# Patient Record
Sex: Male | Born: 1970 | Race: Black or African American | Hispanic: No | Marital: Married | Smoking: Current every day smoker
Health system: Southern US, Community
[De-identification: ages and names within clinical notes are randomized; demographics above are authoritative.]

---

## 2004-11-15 ENCOUNTER — Ambulatory Visit (HOSPITAL_COMMUNITY): Admission: RE | Admit: 2004-11-15 | Discharge: 2004-11-15 | Payer: Self-pay | Admitting: Otolaryngology

## 2005-01-16 ENCOUNTER — Encounter: Admission: RE | Admit: 2005-01-16 | Discharge: 2005-01-16 | Payer: Self-pay | Admitting: Otolaryngology

## 2005-01-30 ENCOUNTER — Ambulatory Visit (HOSPITAL_COMMUNITY): Admission: RE | Admit: 2005-01-30 | Discharge: 2005-01-30 | Payer: Self-pay | Admitting: Otolaryngology

## 2005-01-30 ENCOUNTER — Encounter (INDEPENDENT_AMBULATORY_CARE_PROVIDER_SITE_OTHER): Payer: Self-pay | Admitting: *Deleted

## 2005-01-30 ENCOUNTER — Ambulatory Visit (HOSPITAL_BASED_OUTPATIENT_CLINIC_OR_DEPARTMENT_OTHER): Admission: RE | Admit: 2005-01-30 | Discharge: 2005-01-30 | Payer: Self-pay | Admitting: Otolaryngology

## 2005-12-16 IMAGING — CT CT MAXILLOFACIAL W/O CM
1 of 2 series · 15 of 30 positions shown, 19 images · IV contrast (agent unspecified)
Comparison: None.

CLINICAL DATA: Chronic polypoid pansinusitis/prior sinus surgery.
 MAXILLOFACIAL CT WITHOUT CONTRAST:
 Contiguous 2.5 mm sliced thickness cuts were obtained through the paranasal sinuses with the patient in the extended prone position.  Additional 2 mm axial cuts were obtained.

[Series 3694: — · axial · 0.37mm/px · z∈[-705,-615]mm · 15 of 100 slices shown, 19 images]
[im 5/100  brain]
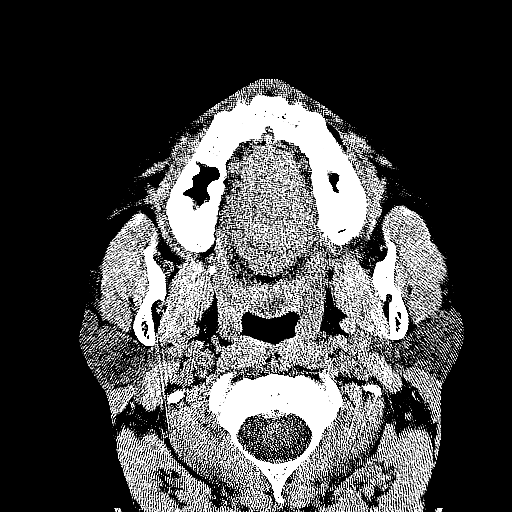
[im 5/100  bone]
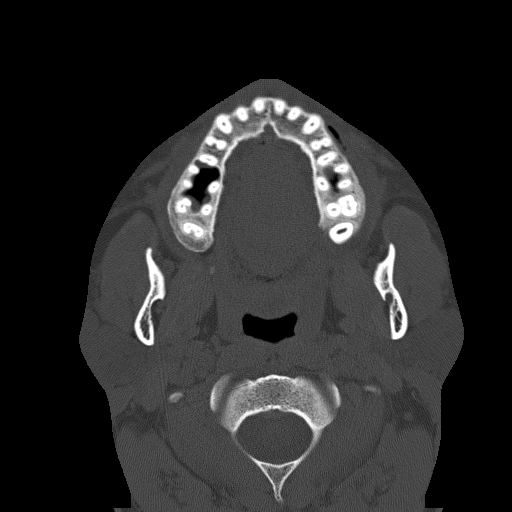
[im 10/100  bone]
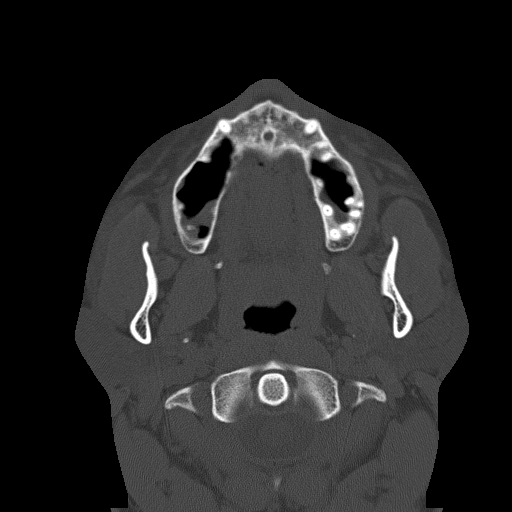
[im 20/100  bone]
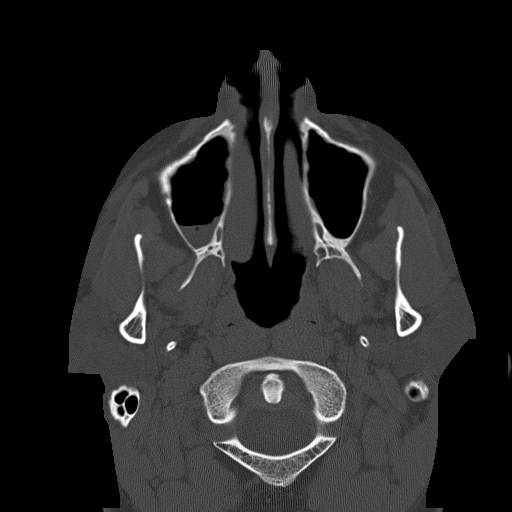
[im 25/100  bone]
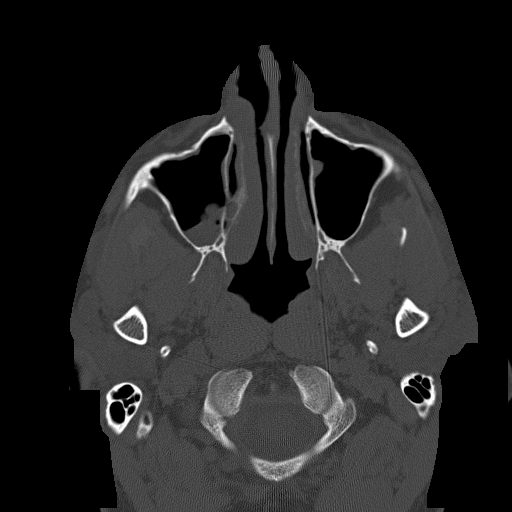
[im 30/100  brain]
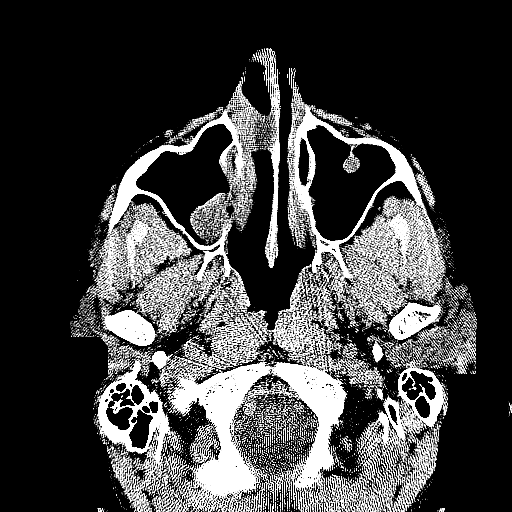
[im 30/100  bone]
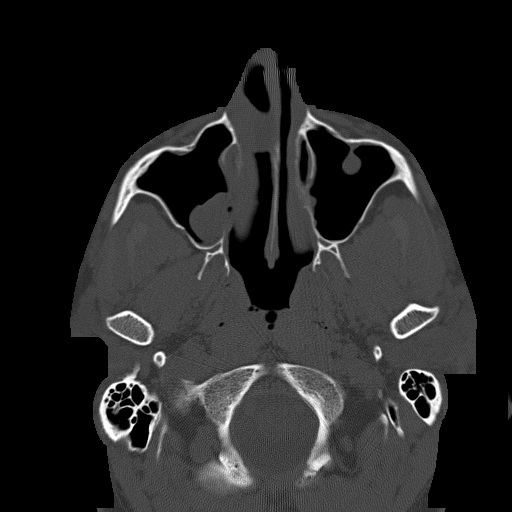
[im 35/100  bone]
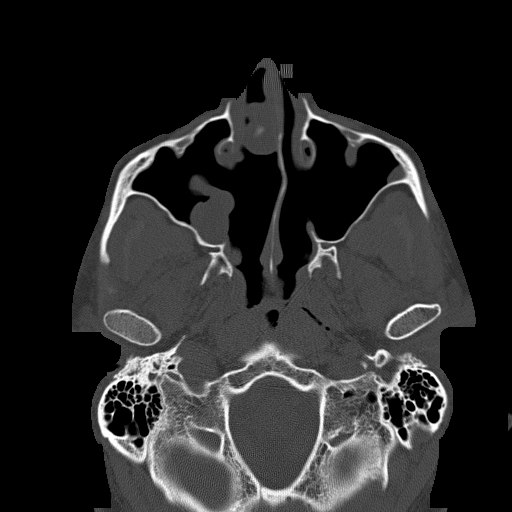
[im 45/100  bone]
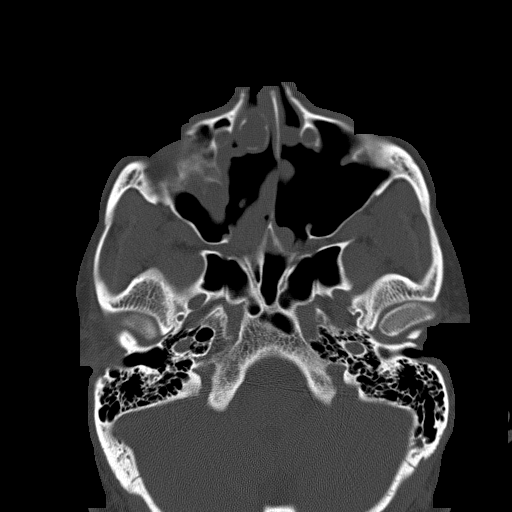
[im 50/100  bone]
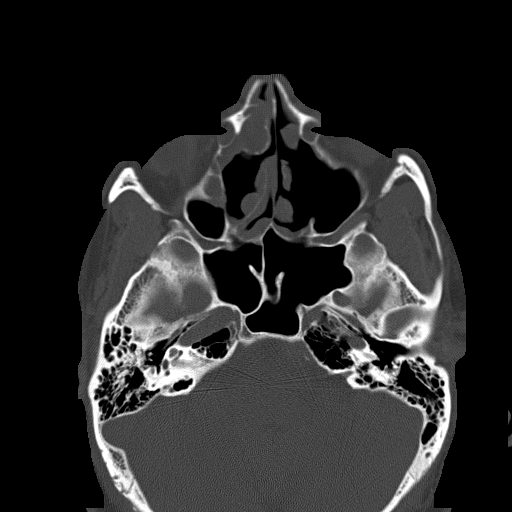
[im 55/100  brain]
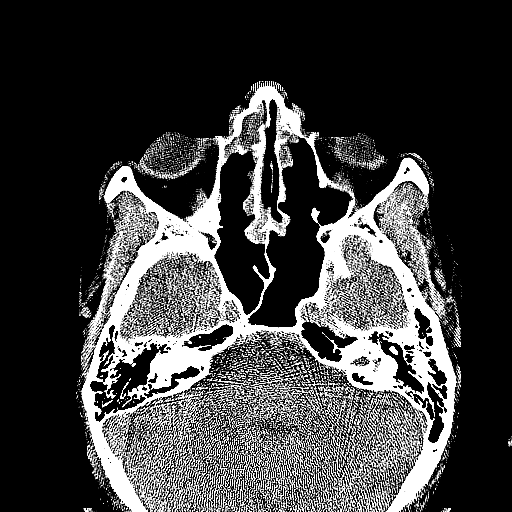
[im 55/100  bone]
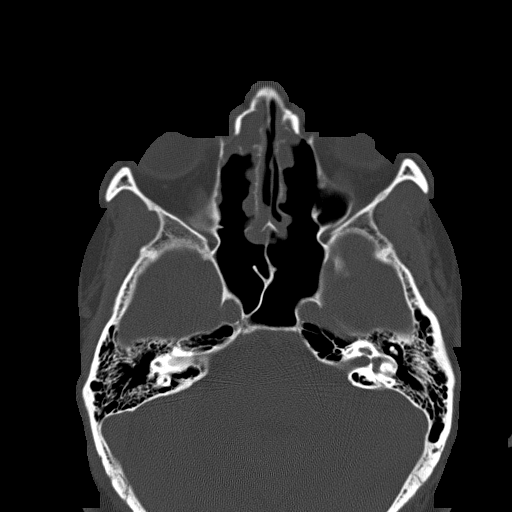
[im 65/100  bone]
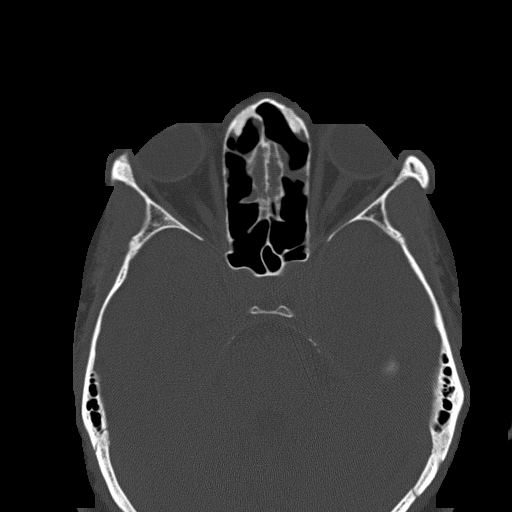
[im 70/100  bone]
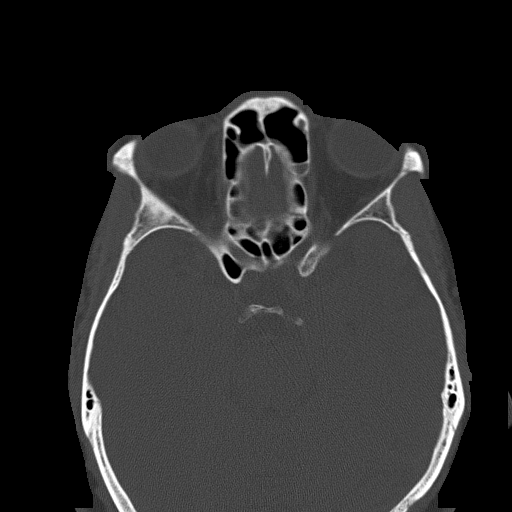
[im 75/100  bone]
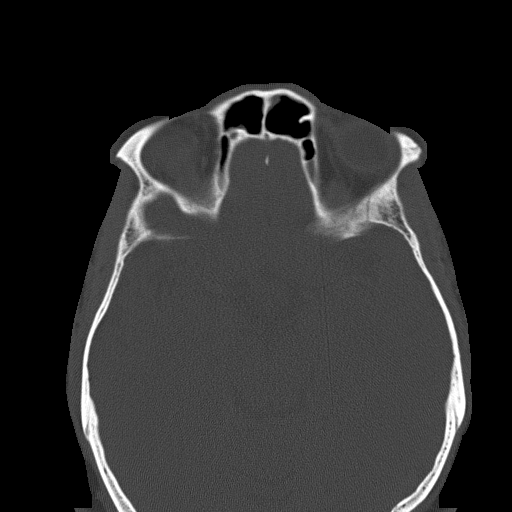
[im 80/100  brain]
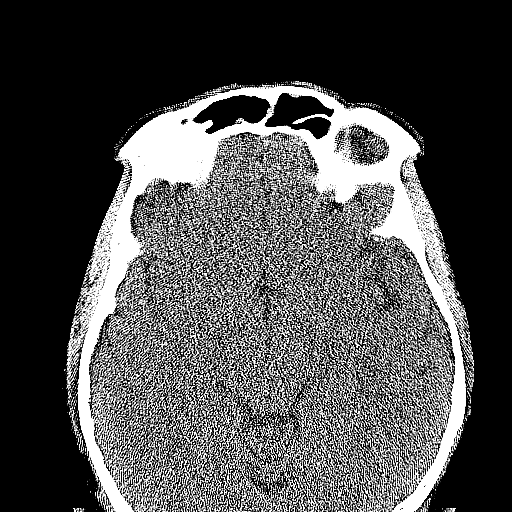
[im 80/100  bone]
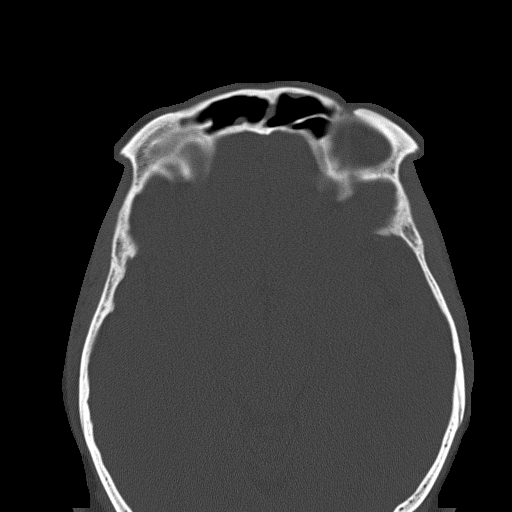
[im 90/100  bone]
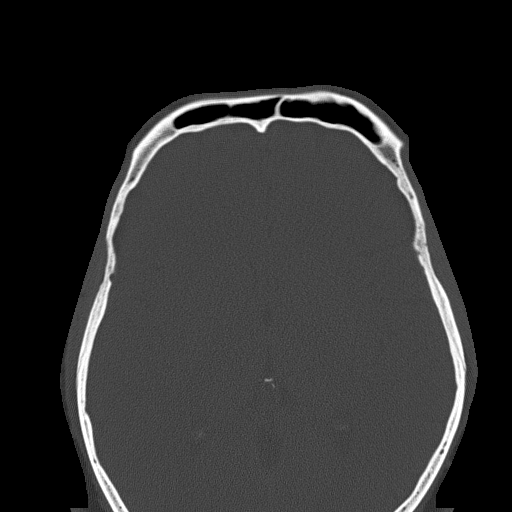
[im 95/100  bone]
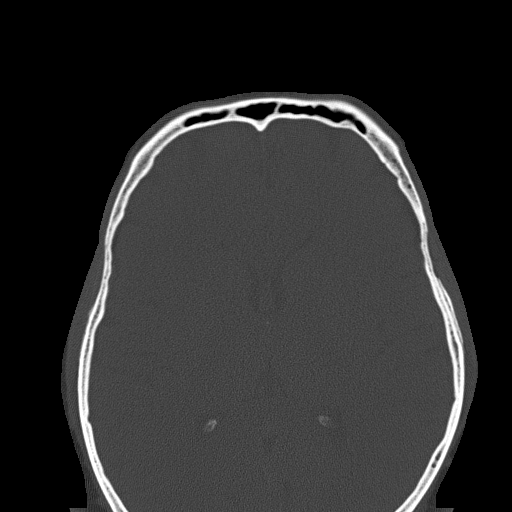

[15 of 30 positions shown; findings below may reference images not displayed]

Frontal sinuses show minimal areas of mucosal thickening.  Some of these are rounded and could be small polyps.  There is no fluid in the frontal sinuses.  
 There is some mucosal thickening of the remaining ethmoid air cells, but many of these septations are no longer evident suggesting prior surgery.  There also appears to be communication between the ethmoid and sphenoid sinuses, likely surgical.  Likewise, patient has had resection of the medial wall and uncinate processes of both maxillary sinuses which communicate openly with the ethmoid sinuses.  Despite this, there is lobulated soft tissue density within both maxillary sinuses, in a polypoid configuration.  This involves the right greater than the left.  There may be a small amount of fluid in the right maxillary sinus.  On the axial cuts, it looks like air fluid level.  In the coronal plane, it looks like it may be simply a meniscus.  Similar, but less prominent changes are noted in the antrum of the left maxillary sinus.  Lobulated soft tissue density is also noted in the nasal turbinates bilaterally, right greater than left.
IMPRESSION: 1.  Chronic polypoid like changes of sinusitis noted as described above.  Polypoid changes are also noted in the nasal turbinates. 
 2.  There may be a small amount of fluid in the maxillary sinuses bilaterally.  
 3.  Extensive post surgical changes.

## 2006-02-16 IMAGING — CT CT MAXILLOFACIAL W/O CM
1 series · 15 of 30 positions shown, 19 images · IV contrast (agent unspecified)
Comparison: none

CLINICAL DATA: 33 year old with sinusitis. 
 CT SINUSES WITHOUT CONTRAST:
 CT examination of the paranasal sinuses was performed with coronal and axial imaging with the Insta-Trak in place on the axial images for preoperative assessment and intraoperative guidance.   This examination is compared to the previous study from [HOSPITAL] which is dated 11/15/04.
 When compared to the prior study, there is markedly progressive sinonasal polyposis.  The maxillary sinuses are demonstrating significant increased opacification with polypoid soft tissue density throughout.  This extends into the nasal cavities.  There appears to be a concha bullosa of the middle turbinate on the right which appears to be filled with soft tissue density.  There are extensive surgical changes related to previous sinonasal surgery with wide communication between the maxillary sinuses and the nasal cavity.  The middle ear cavities and mastoid air cells are clear.  There is minimal mucoperiosteal thickening or small polyps in the left half of the sphenoid sinus.  The frontal sinuses demonstrate minimal mucoperiosteal thickening.  There is significant deviation of the bony nasal septum leftward with leftward spurring which does narrow the left inferior meatus.  There is mucosal thickening of the inferior turbinate and also the left middle turbinate.

[Series 2: sinus prone · axial · 0.33mm/px · z∈[+37,+134]mm · 15 of 40 slices shown, 19 images]
[im 2/40  brain]
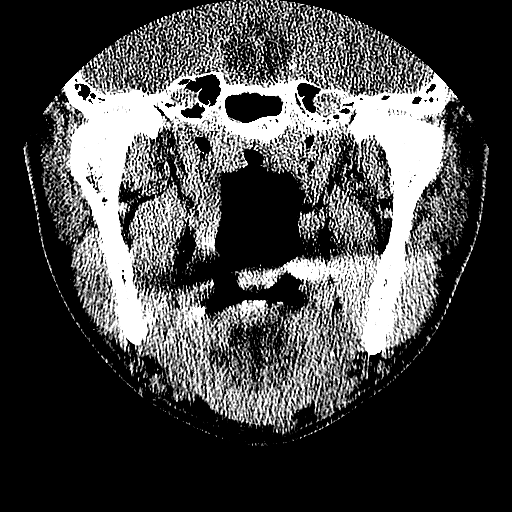
[im 2/40  bone]
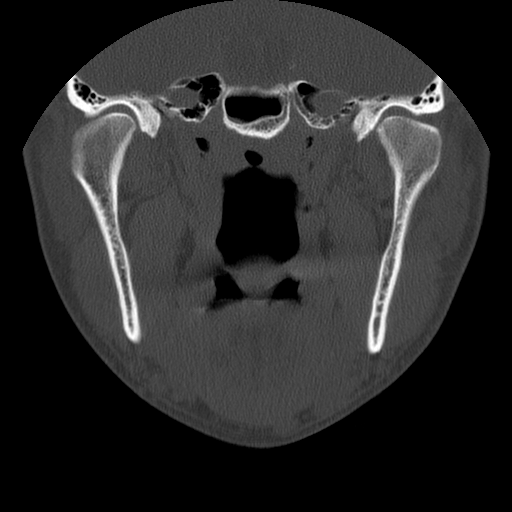
[im 5/40  bone]
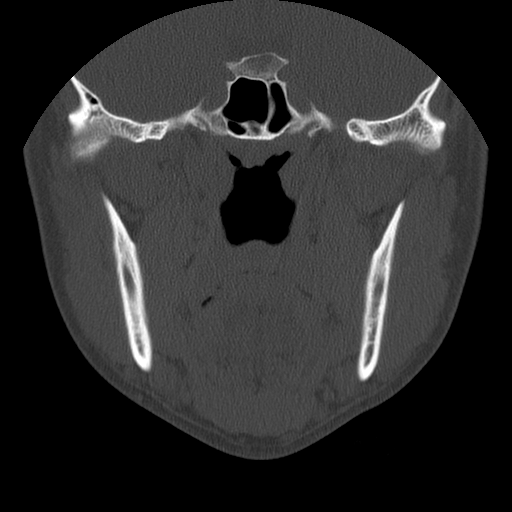
[im 7/40  bone]
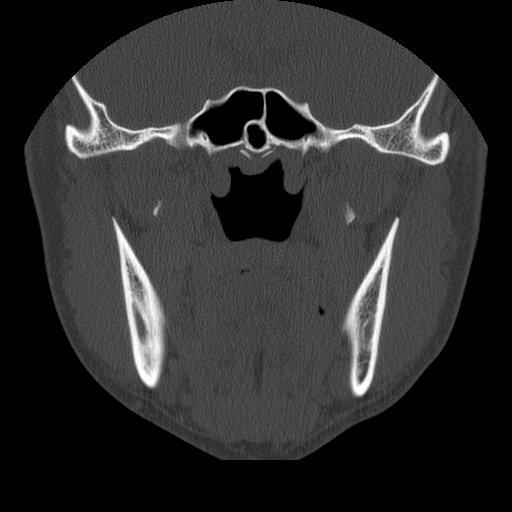
[im 10/40  bone]
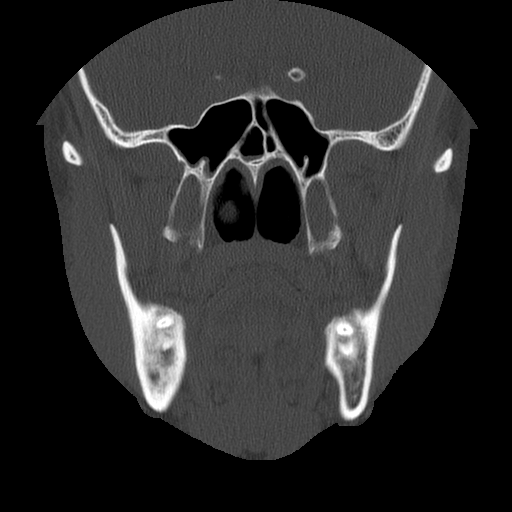
[im 13/40  brain]
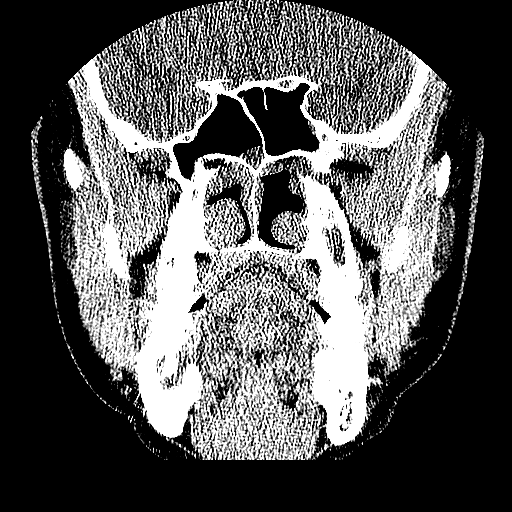
[im 13/40  bone]
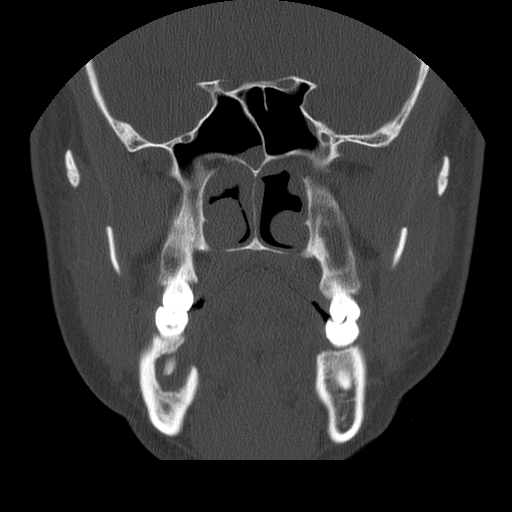
[im 15/40  bone]
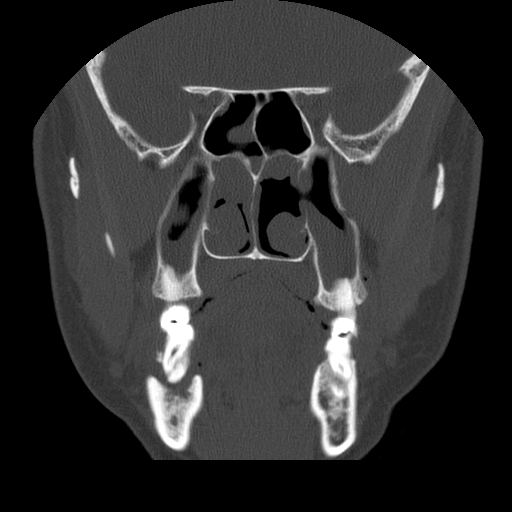
[im 18/40  bone]
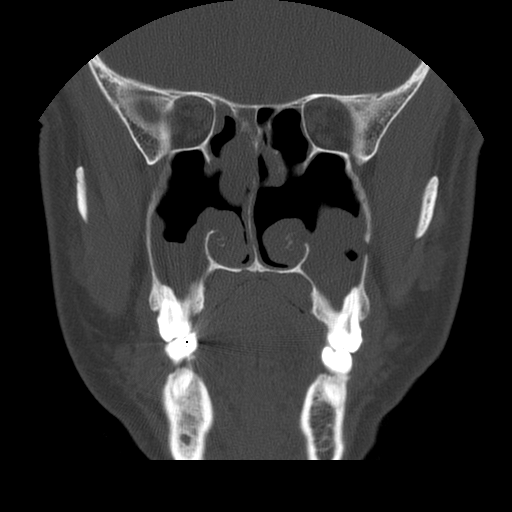
[im 21/40  bone]
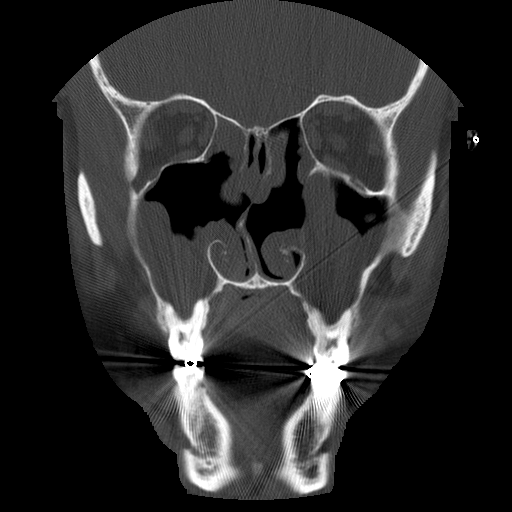
[im 22/40  brain]
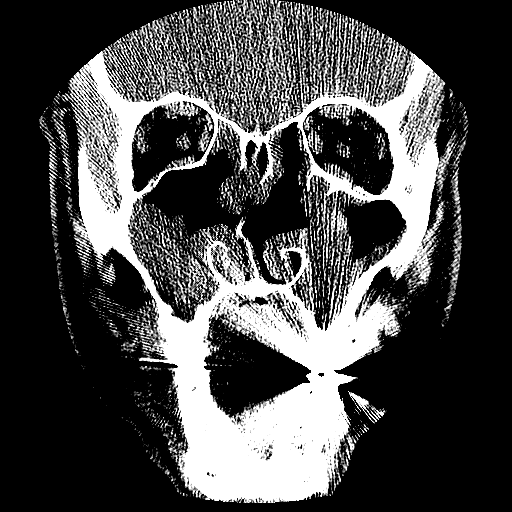
[im 22/40  bone]
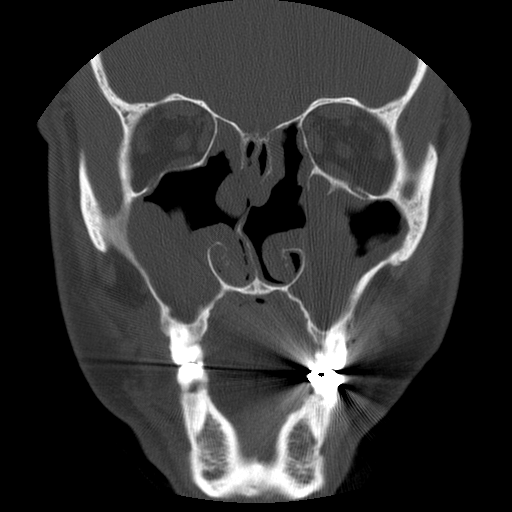
[im 25/40  bone]
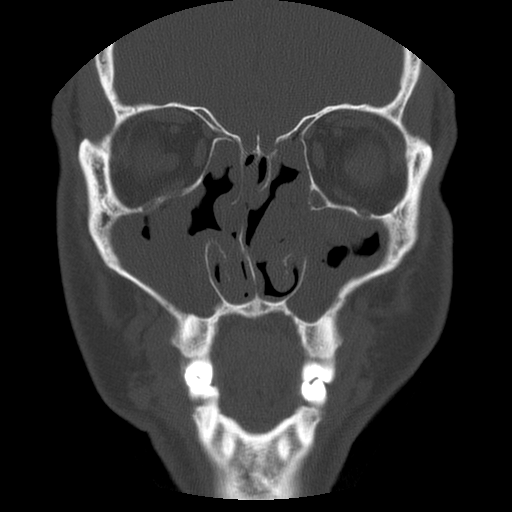
[im 27/40  bone]
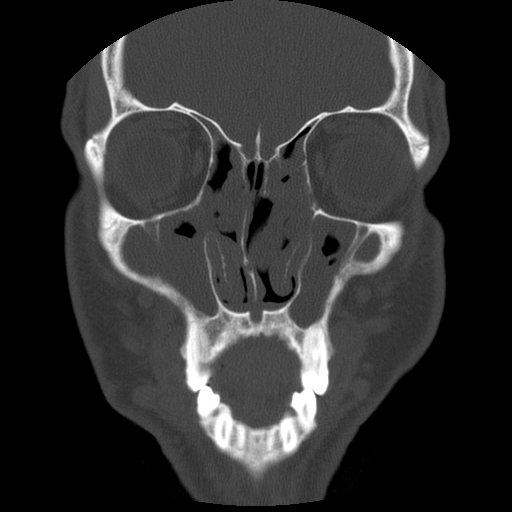
[im 30/40  bone]
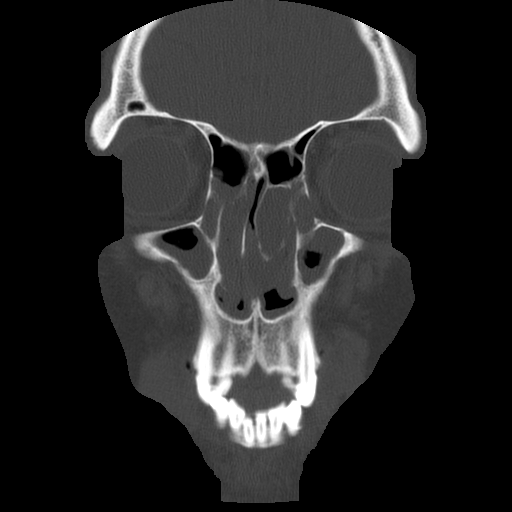
[im 33/40  brain]
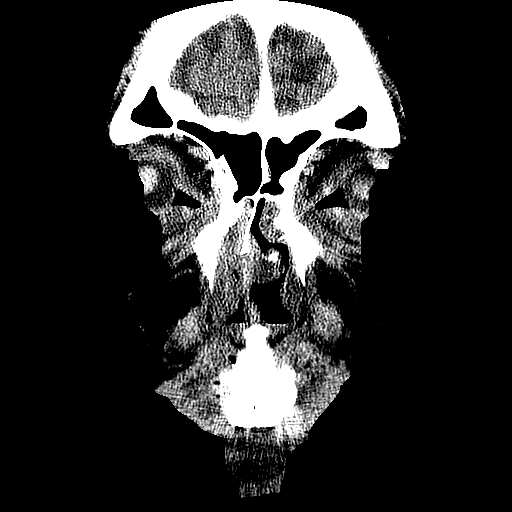
[im 33/40  bone]
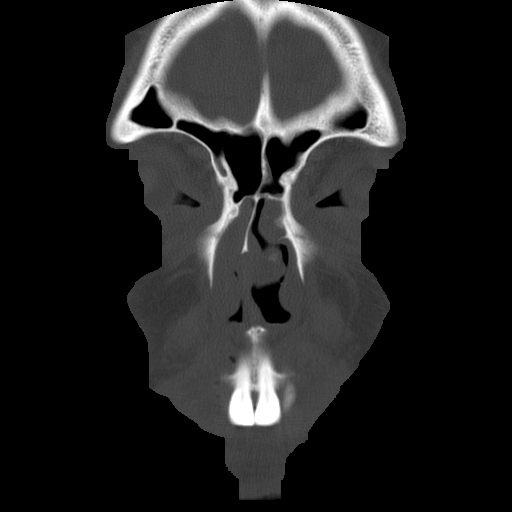
[im 35/40  bone]
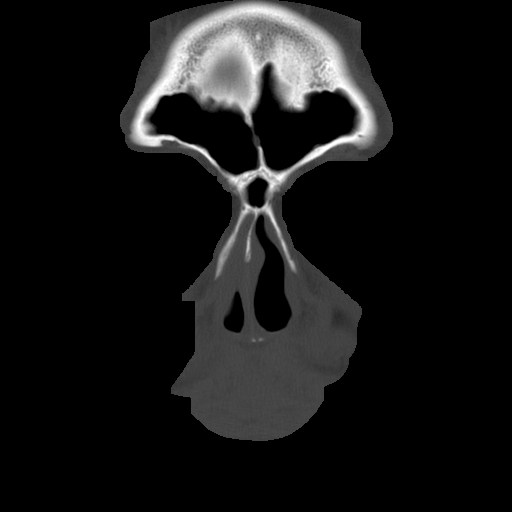
[im 38/40  bone]
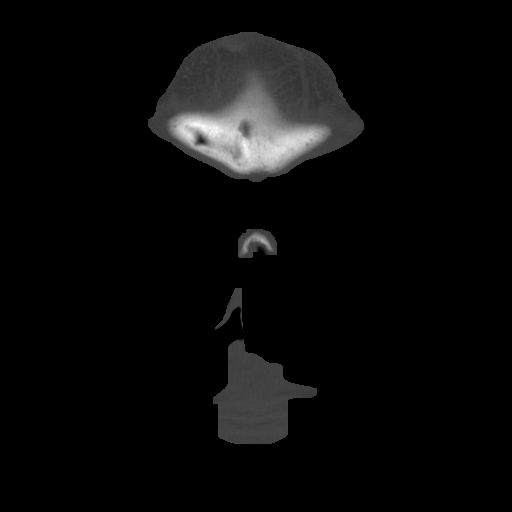

[15 of 30 positions shown; findings below may reference images not displayed]

IMPRESSION: 1.  Insta-Trak imaging for preoperative assessment and intraoperative guidance. 
 2.  Significantly progressive sinonasal polyposis when compared to the prior CT scan from [DATE].  Stable post surgical changes with wide communication between the maxillary sinuses and the nasal cavity.
 4.  Leftward deviation of the bony nasal septum with narrowing of the left inferior meatus.
 5.  Mucosal thickening of the left middle turbinate and inferior turbinates bilaterally.  
 6.  Relatively clear frontal and sphenoid sinuses. 
 7.  Clear middle ear cavities and mastoid air cells.

## 2009-10-03 ENCOUNTER — Emergency Department (HOSPITAL_COMMUNITY): Admission: EM | Admit: 2009-10-03 | Discharge: 2009-10-03 | Payer: Self-pay | Admitting: Emergency Medicine

## 2011-01-13 NOTE — Op Note (Signed)
NAME:  Cristian Brown, Cristian Brown                ACCOUNT NO.:  [id number]   MEDICAL RECORD NO.:  [id number]          PATIENT TYPE:  AMB   LOCATION:  [facility]SC                          FACILITY:  [facility]   PHYSICIAN:  Karol T. Lazarus Salines, M.D. 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 Right ethmoid/middle turbinate conchal  mucocele.   POSTOPERATIVE DIAGNOSIS:  Chronic polypoid pansinusitis/polypoid  degeneration of nose and paranasal sinuses. Right ethmoid/middle turbinate  conchal mucocele.   PROCEDURE PERFORMED:  Right endoscopic revision anterior ethmoidectomy.  Bilateral nasal endoscopic polypectomy with InstaTrak guidance.   SURGEON:  Gloris Manchester. Lazarus Salines, M.D.   ANESTHESIA:  General orotracheal.   BLOOD LOSS:  Minimal.   COMPLICATIONS:  None.   FINDINGS:  Overall anterior leftward septal deviation. Polypoid degeneration  of the mucosa throughout the previous surgical sinus blocks with some frank  polyps, right more so than left. On the right, polyps obstructing the  surgical antrostomy. A large expanded middle turbinate conchal cell/mucocele  with some polypoid degeneration of the anterior pole of the middle  turbinate.   PROCEDURE:  With the patient in the comfortable supine position, general  orotracheal anesthesia was induced without difficulty. At an appropriate  level, the patient was placed in a slight sitting position. A saline  moistened throat pack was placed. Nasal vibrissae were trimmed. Cocaine  crystals, 200 mg total were applied on cotton carriers to the anterior  ethmoid and sphenopalatine ganglion regions on both sides. Cocaine solution,  160 mg total, was applied on 1/2 x 3 inch cottonoids to both sides of the  septal mucosa. One percent Xylocaine with 1:100,000 epinephrine was  infiltrated into the anterior nose including the septum and lateral walls, 8  cc total. Several minutes were allowed for these to take effect. A sterile  preparation and draping of the midface was accomplished including placement  of the InstaTrak head gear.   The materials were removed from the nose and observed to be intact and  correct in number. Additional 1% Xylocaine with 1:100,000 epinephrine on a  25 gauge spinal needle were used to infiltrate the polyps and the middle  turbinates on both sides. Additional time was allowed for hemostasis to take  effect. The InstaTrak apparatus was registered, calibrated, and verified in  the standard fashion. Good registration was observed.   Beginning on the left side of the nose, the 0 degree endoscope was used to  completely inspect the nasal chamber. There were polyps up in the ethmoid  block which were gently removed with the power debrider. There were more  moderate, more involved polyps back into the sphenoethmoidal recess which  were also removed. No bony work was required on the left side. The fairly  large polyps involving the antrostomy were also removed with the power  debrider. This material was sent for separate specimen. There was no  evidence of violation of lamina papyracea or the fovea ethmoidalis. This  side was completed and attention was returned to the right side.   The 0 degree endoscope was once again used to inspect the nasal cavity. Some  polypoid degeneration in the antrostomy and in the mid and posterior ethmoid  blocks were removed with the power debrider. The sphenoethmoidal recess also  was partially blocked and the polyps here were carefully removed. No bony  work was required.   Returning anteriorly in the right nose, a polypoid lesion consistent with  the mucocele on x-ray was first debulked with a power debrider. Upon  encountering the bony skeleton, the lateral wall was opened with the punch  forceps and  carefully enlarged with the punch forceps and with the power  debrider. The medial aspect of the concha and comprising the remnant of the  middle turbinate, was stable and firm and was not further disturbed. Some  polypoid changes were removed approaching the nasal frontal recess which was  opened but not instrumented. At this point with better visualization upon  removal of the mucocele, some additional polypoid changes in the middle  ethmoid region were removed. Polypoid changes down into the antrum were  removed. There was a Haller cell on the x-ray which was opened with the  punch forceps and the area was debrided. This completed the removal of the  mucocele and the polypoid change on the right side. Inspection of both sides  revealed no signs of spinal fluid leak or violation of lamina papyracea.  Hemostasis was observed on both sides. The nose was carefully suctioned  clean.   A triple thickness Neosporin-impregnated Telfa pack with a 2-0 silk tag was  placed in the ethmoid block in each side and tied externally. Again,  hemostasis was observed. The pharynx was suctioned free and throat pack was  removed. A bite block was placed for anesthesia. The patient was returned to  anesthesia, awakened, extubated, and transferred to recovery in stable  condition.    A 40 year old black male with a long history of nasal polyposis status post  a previous endoscopic sinus exenteration by Dr. Tamera Stands in 1997 has  recently developed polypoid obstruction and had a painful syndrome  consistent with an infected mucocele of the right middle turbinate several  months back, hence the indication for the several components of today's  procedure. Anticipate a routine postoperative recovery with attention to  analgesia, antibiosis, ice, elevation, nasal hygiene measures. We will  remove the nasal packs in 1-2 days. He will limit strenuous activities for  two weeks.     KTW/MEDQ  D:  01/30/2005  T:   01/30/2005  Job:  [id number]   cc:   Laverle Hobby, M.D.

## 2018-12-11 DIAGNOSIS — L0201 Cutaneous abscess of face: Secondary | ICD-10-CM | POA: Diagnosis not present

## 2020-03-17 DIAGNOSIS — R208 Other disturbances of skin sensation: Secondary | ICD-10-CM | POA: Diagnosis not present

## 2020-03-17 DIAGNOSIS — Z0189 Encounter for other specified special examinations: Secondary | ICD-10-CM | POA: Diagnosis not present

## 2020-03-25 ENCOUNTER — Encounter (INDEPENDENT_AMBULATORY_CARE_PROVIDER_SITE_OTHER): Payer: Self-pay | Admitting: *Deleted

## 2020-04-06 DIAGNOSIS — Z1329 Encounter for screening for other suspected endocrine disorder: Secondary | ICD-10-CM | POA: Diagnosis not present

## 2020-04-06 DIAGNOSIS — Z0000 Encounter for general adult medical examination without abnormal findings: Secondary | ICD-10-CM | POA: Diagnosis not present

## 2020-04-16 DIAGNOSIS — Z0001 Encounter for general adult medical examination with abnormal findings: Secondary | ICD-10-CM | POA: Diagnosis not present

## 2020-09-01 ENCOUNTER — Other Ambulatory Visit: Payer: Self-pay

## 2020-09-01 DIAGNOSIS — Z20822 Contact with and (suspected) exposure to covid-19: Secondary | ICD-10-CM

## 2020-09-03 LAB — SARS-COV-2, NAA 2 DAY TAT

## 2020-09-03 LAB — NOVEL CORONAVIRUS, NAA: SARS-CoV-2, NAA: NOT DETECTED

## 2021-05-04 ENCOUNTER — Encounter: Payer: Self-pay | Admitting: *Deleted

## 2021-06-06 ENCOUNTER — Ambulatory Visit: Payer: 59

## 2021-06-24 ENCOUNTER — Ambulatory Visit (INDEPENDENT_AMBULATORY_CARE_PROVIDER_SITE_OTHER): Payer: 59 | Admitting: Allergy & Immunology

## 2021-06-24 ENCOUNTER — Other Ambulatory Visit: Payer: Self-pay

## 2021-06-24 VITALS — BP 120/70 | HR 61 | Temp 97.8°F | Resp 16 | Ht 71.0 in | Wt 210.0 lb

## 2021-06-24 DIAGNOSIS — J330 Polyp of nasal cavity: Secondary | ICD-10-CM | POA: Diagnosis not present

## 2021-06-24 DIAGNOSIS — J3089 Other allergic rhinitis: Secondary | ICD-10-CM

## 2021-06-24 DIAGNOSIS — J302 Other seasonal allergic rhinitis: Secondary | ICD-10-CM

## 2021-06-24 NOTE — Progress Notes (Signed)
NEW PATIENT  Date of Service/Encounter:  06/24/21  Consult requested by: Celene Squibb, MD   Assessment:   Polyp of nasal cavity  NSAID sensitivity - with worsening congestion with the use of them  Seasonal and perennial allergic rhinitis (grasses, weeds, trees, indoor molds, and outdoor molds)  Plan/Recommendations:    1. Polyp of nasal cavity - There are expectations that grow from the walls of the nose due to uncontrolled allergic disease. - We are going to start Xhance 2 sprays per nostril twice daily to help decrease the size of these. - They will call to arrange delivery of the [facility] via mail. - We are going to continue this for 3 months and if there is not enough improvement, we can submit for Dupixent. - Information on Dupixent provided. - This will help to shrink the polyps and help you to be able to breathe.  2. Seasonal and perennial allergic rhinitis - Testing today showed: grasses, weeds, trees, indoor molds, and outdoor molds - Copy of test results provided.  - Avoidance measures provided. - Start taking: Zyrtec (cetirizine) 10mg  tablet once daily (ESPECIALLY during the pollen season and when you are mowing the lawn) - You can use an extra dose of the antihistamine, if needed, for breakthrough symptoms.  - Consider nasal saline rinses 1-2 times daily to remove allergens from the nasal cavities as well as help with mucous clearance (this is especially helpful to do before the nasal sprays are given) - Consider allergy shots as a means of long-term control. - Allergy shots "re-train" and "reset" the immune system to ignore environmental allergens and decrease the resulting immune response to those allergens (sneezing, itchy watery eyes, runny nose, nasal congestion, etc).    - Allergy shots improve symptoms in 75-85% of patients.  - We can discuss more at the next appointment if the medications are not working for you.  3. Return in about 3 months (around  09/24/2021).    This note in its entirety was forwarded to the Provider who requested this consultation.  Subjective:   Cristian Brown is a 50 y.o. male presenting today for evaluation of  Chief Complaint  Patient presents with   Allergy Testing   Sinusitis    Patient in today for allergie testing to see if it will help with his constant stuffy nose.    Cristian Brown has a history of the following: Patient Active Problem List   Diagnosis Date Noted   Seasonal and perennial allergic rhinitis 06/28/2021   Polyp of nasal cavity 06/28/2021     History obtained from: chart review and patient.  Cristian Brown was referred by Celene Squibb, MD.     Cristian Brown is a 50 y.o. male presenting for an evaluation of chronic congestion .      Allergic Rhinitis Symptom History: He reports a lot of nasal drainage. He has been through 2 sinus surgeries. The first one was done in 1996 in [place]. In June 2006, he had another one done by Dr. Polly Cobia. It helps for 5 or so years, but then the symptoms resume with a vengeance. He is always draining and always congested.  This is throughout the year. He does have postnasal drip and constant now blowing. He does not clear his throat too often. He does spit a lot.   He has not been on nose sprays in the past. Prior to the surgeries, he was on steroids. After the surgeries, he was on Flonase  and other sinus sprays. He does not use Astelin and he has not needed steroids in a while. He did try Claritin-D for around 3 months and it did not help much.  He has had some sinus infections but these are infrequent. He had one month ago. He has noticed that things get worse with ibuprofen and Aleve and Tylenol.  He has no breathing problems at all.  He denies any wheezing or coughing.  He has never needed albuterol.  Sinus CT (May 2006)  IMPRESSION:  1. Insta-Trak imaging for preoperative assessment and intraoperative guidance.  2. Significantly progressive sinonasal  polyposis when compared to the prior CT scan from March.  3. Stable post surgical changes with wide communication between the maxillary sinuses and the nasal cavity.  4. Leftward deviation of the bony nasal septum with narrowing of the left inferior meatus.  5. Mucosal thickening of the left middle turbinate and inferior turbinates bilaterally.  6. Relatively clear frontal and sphenoid sinuses.  7. Clear middle ear cavities and mastoid air cells.   Otherwise, there is no history of other atopic diseases, including asthma, food allergies, drug allergies, stinging insect allergies, eczema, urticaria, or contact dermatitis. There is no significant infectious history. Vaccinations are up to date.    Past Medical History: Patient Active Problem List   Diagnosis Date Noted   Seasonal and perennial allergic rhinitis 06/28/2021   Polyp of nasal cavity 06/28/2021     Medication List:  Allergies as of 06/24/2021   Not on File      Medication List        Accurate as of June 24, 2021 11:59 PM. If you have any questions, ask your nurse or doctor.          nebivolol 10 MG tablet Commonly known as: BYSTOLIC Take 10 mg by mouth every other day.        Birth History: non-contributory  Developmental History: non-contributory  Past Surgical History: History reviewed. No pertinent surgical history.   Family History: History reviewed. No pertinent family history.   Social History: Cristian Brown lives at home with his family.  He was a house that is 52 years old.  There is hardwood in the main living areas and carpeting in the bedroom.  They have a plan for heating and cooling and would supplemental heating.  There are no dust mite covers on the bedding.  There is tobacco exposure in the car.  He fixes machines the past 6 months.  He is exposed to fumes, chemicals, and dust. He is working at [employer].    Review of Systems  Constitutional: Negative.  Negative for chills, fever,  malaise/fatigue and weight loss.  HENT:  Positive for congestion. Negative for ear discharge and ear pain.        Positive for postnasal drip.   Eyes:  Negative for pain, discharge and redness.  Respiratory:  Negative for cough, sputum production, shortness of breath and wheezing.   Cardiovascular: Negative.  Negative for chest pain and palpitations.  Gastrointestinal:  Negative for abdominal pain, constipation, diarrhea, heartburn, nausea and vomiting.  Skin: Negative.  Negative for itching and rash.  Neurological:  Negative for dizziness and headaches.  Endo/Heme/Allergies:  Negative for environmental allergies. Does not bruise/bleed easily.      Objective:   Blood pressure 120/70, pulse 61, temperature 97.8 F (36.6 C), temperature source Temporal, resp. rate 16, height 5\' 11"  (1.803 m), weight 210 lb (95.3 kg), SpO2 99 %. Body mass index is  29.29 kg/m.   Physical Exam:   Physical Exam Vitals reviewed.  Constitutional:      Appearance: He is well-developed.  HENT:     Head: Normocephalic and atraumatic.     Right Ear: Tympanic membrane, ear canal and external ear normal. No drainage, swelling or tenderness. Tympanic membrane is not injected, scarred, erythematous, retracted or bulging.     Left Ear: Tympanic membrane, ear canal and external ear normal. No drainage, swelling or tenderness. Tympanic membrane is not injected, scarred, erythematous, retracted or bulging.     Nose: No nasal deformity, septal deviation, mucosal edema or rhinorrhea.     Right Turbinates: Enlarged, swollen and pale.     Left Turbinates: Enlarged, swollen and pale.     Right Sinus: No maxillary sinus tenderness or frontal sinus tenderness.     Left Sinus: No maxillary sinus tenderness or frontal sinus tenderness.     Comments: Bilateral nasal polyposis.  It is obstructing around 100% of the airway in the left side and 75 to 90% on the right side.    Mouth/Throat:     Mouth: Mucous membranes are not  pale and not dry.     Pharynx: Uvula midline.     Comments: Moderate cobblestoning. Eyes:     General:        Right eye: No discharge.        Left eye: No discharge.     Conjunctiva/sclera: Conjunctivae normal.     Right eye: Right conjunctiva is not injected. No chemosis.    Left eye: Left conjunctiva is not injected. No chemosis.    Pupils: Pupils are equal, round, and reactive to light.  Cardiovascular:     Rate and Rhythm: Normal rate and regular rhythm.     Heart sounds: Normal heart sounds.  Pulmonary:     Effort: Pulmonary effort is normal. No tachypnea, accessory muscle usage or respiratory distress.     Breath sounds: Normal breath sounds. No wheezing, rhonchi or rales.  Chest:     Chest wall: No tenderness.  Abdominal:     Tenderness: There is no abdominal tenderness. There is no guarding or rebound.  Lymphadenopathy:     Head:     Right side of head: No submandibular, tonsillar or occipital adenopathy.     Left side of head: No submandibular, tonsillar or occipital adenopathy.     Cervical: No cervical adenopathy.  Skin:    Coloration: Skin is not pale.     Findings: No abrasion, erythema, petechiae or rash. Rash is not papular, urticarial or vesicular.  Neurological:     Mental Status: He is alert.     Diagnostic studies:   Allergy Studies:   Routine  1. Control-Buffer 50% Glycerol Negative      2. Control-Histamine 1 mg/ml 2+      3. Albumin saline Negative      Grasses  4. [place] 4+      5. [place] Negative      6. [place] Negative      7. [place] Blue Negative      8. Meadow Fescue Negative      9. Perennial Rye 4+      10. Sweet Vernal 4+      11. Timothy 4+      Weeds  12. Cocklebur Negative      13. Burweed Marshelder Negative      14. Ragweed, short Negative      15. Ragweed, Giant Negative  16. Plantain,  English 2+      17. Lamb's Quarters 2+      18. Sheep Sorrell 2+      19. Rough Pigweed 2+      20. Marsh Elder, Rough Negative       21. Mugwort, Common 2+      Trees  22. Ash mix 2+      23. Birch mix 2+      24. Beech American 2+      25. Box, Elder 2+      26. Cedar, red Negative      27. Cottonwood, [place] Negative      28. Elm mix Negative      29. Hickory Negative      30. Maple mix Negative      31. Oak, [place] mix Negative      32. Pecan Pollen 3+      33. Pine mix Negative      34. Sycamore Eastern Negative      35. Walnut, Black Pollen Negative      Major Molds Mix (seasonal) 1  36. Alternaria alternata Negative      37. Cladosporium Herbarum Negative      Major Molds Mix (perennial ) 2  38. Aspergillus mix Negative      39. Penicillium mix Negative      Minor Mold Mix (seasonal) 3  40. Bipolaris sorokiniana (Helminthosporium) Negative      41. Drechslera spicifera (Curvularia) Negative      42. Mucor plumbeus Negative      Minor Molds Mix (perennial ) 4  43. Fusarium moniliforme Negative      44. Aureobasidium pullulans (pullulara) Negative      45. Rhizopus oryzae Negative      Other Molds  46. Botrytis cinera Negative      47. Epicoccum nigrum Negative      48. Phoma betae Negative      49. Candida Albicans Negative      50. Trichophyton mentagrophytes Negative      Inhalants  51. Mite, D Farinae  5,000 AU/ml Negative      52. Mite, D Pteronyssinus  5,000 AU/ml Negative      53. Cat Hair 10,000 BAU/ml Negative      54.  Dog Epithelia Negative      55. Mixed Feathers Negative      56. Horse Epithelia Negative      57. Cockroach, German Negative      58. Mouse Negative      59. Tobacco Leaf Negative        Intradermal  Control Negative      [place] 3+      Johnson 4+      Ragweed mix Negative      Mold 1 Negative      Mold 2 Negative      Mold 3 2+      Mold 4 2+      Cat Negative      Dog Negative      Cockroach Negative      Mite mix Negative        Allergy testing results were read and interpreted by myself, documented by clinical staff.         Salvatore Marvel, MD [facility]

## 2021-06-24 NOTE — Patient Instructions (Addendum)
1. Polyp of nasal cavity - There are expectations that grow from the walls of the nose due to uncontrolled allergic disease. - We are going to start Xhance 2 sprays per nostril twice daily to help decrease the size of these. - They will call to arrange delivery of the [facility] via mail. - We are going to continue this for 3 months and if there is not enough improvement, we can submit for Dupixent. - Information on Dupixent provided. - This will help to shrink the polyps and help you to be able to breathe.  2. Seasonal and perennial allergic rhinitis - Testing today showed: grasses, weeds, trees, indoor molds, and outdoor molds - Copy of test results provided.  - Avoidance measures provided. - Start taking: Zyrtec (cetirizine) 10mg  tablet once daily (ESPECIALLY during the pollen season and when you are mowing the lawn) - You can use an extra dose of the antihistamine, if needed, for breakthrough symptoms.  - Consider nasal saline rinses 1-2 times daily to remove allergens from the nasal cavities as well as help with mucous clearance (this is especially helpful to do before the nasal sprays are given) - Consider allergy shots as a means of long-term control. - Allergy shots "re-train" and "reset" the immune system to ignore environmental allergens and decrease the resulting immune response to those allergens (sneezing, itchy watery eyes, runny nose, nasal congestion, etc).    - Allergy shots improve symptoms in 75-85% of patients.  - We can discuss more at the next appointment if the medications are not working for you.  3. Return in about 3 months (around 09/24/2021).    Please inform us of any Emergency Department visits, hospitalizations, or changes in symptoms. Call us before going to the ED for breathing or allergy symptoms since we might be able to fit you in for a sick visit. Feel free to contact us anytime with any questions, problems, or concerns.  It was a pleasure to meet you  today!  Websites that have reliable patient information: 1. American Academy of Asthma, Allergy, and Immunology: www.aaaai.org 2. Food Allergy Research and Education (FARE): foodallergy.org 3. Mothers of Asthmatics: http://www.asthmacommunitynetwork.org 4. American College of Allergy, Asthma, and Immunology: www.acaai.org   COVID-19 Vaccine Information can be found at: ShippingScam.co.uk For questions related to vaccine distribution or appointments, please email [email or web address] or call [phone].   We realize that you might be concerned about having an allergic reaction to the COVID19 vaccines. To help with that concern, WE ARE OFFERING THE COVID19 VACCINES IN OUR OFFICE! Ask the front desk for dates!     "Like" [place] on Facebook and Instagram for our latest updates!      A healthy democracy works best when [employer] participate! Make sure you are registered to vote! If you have moved or changed any of your contact information, you will need to get this updated before voting!  In some cases, you MAY be able to register to vote online: [email or web address]    EARLY VOTING HAS STARTED! If you still need to register to vote, you can do this and cast a ballot at any of the early voting locations!       Reducing Pollen Exposure  The American Academy of Allergy, Asthma and Immunology suggests the following steps to reduce your exposure to pollen during allergy seasons.    Do not hang sheets or clothing out to dry; pollen may collect on these items. Do not mow lawns or spend time around freshly cut grass;  mowing stirs up pollen. Keep windows closed at night.  Keep car windows closed while driving. Minimize morning activities outdoors, a time when pollen counts are usually at their highest. Stay indoors as much as possible when pollen counts or humidity is high and on windy days when pollen  tends to remain in the air longer. Use air conditioning when possible.  Many air conditioners have filters that trap the pollen spores. Use a HEPA room air filter to remove pollen form the indoor air you breathe.  Control of Mold Allergen   Mold and fungi can grow on a variety of surfaces provided certain temperature and moisture conditions exist.  Outdoor molds grow on plants, decaying vegetation and soil.  The major outdoor mold, Alternaria and Cladosporium, are found in very high numbers during hot and dry conditions.  Generally, a late Summer - Fall peak is seen for common outdoor fungal spores.  Rain will temporarily lower outdoor mold spore count, but counts rise rapidly when the rainy period ends.  The most important indoor molds are Aspergillus and Penicillium.  Dark, humid and poorly ventilated basements are ideal sites for mold growth.  The next most common sites of mold growth are the bathroom and the kitchen.  Outdoor (Seasonal) Mold Control  Positive outdoor molds via skin testing: Bipolaris (Helminthsporium), Drechslera (Curvalaria), and Mucor  Use air conditioning and keep windows closed Avoid exposure to decaying vegetation. Avoid leaf raking. Avoid grain handling. Consider wearing a face mask if working in moldy areas.    Indoor (Perennial) Mold Control   Positive indoor molds via skin testing: Fusarium, Aureobasidium (Pullulara), and Rhizopus  Maintain humidity below 50%. Clean washable surfaces with 5% bleach solution. Remove sources e.g. contaminated carpets.

## 2021-06-28 ENCOUNTER — Encounter: Payer: Self-pay | Admitting: Allergy & Immunology

## 2021-06-28 DIAGNOSIS — J302 Other seasonal allergic rhinitis: Secondary | ICD-10-CM | POA: Insufficient documentation

## 2021-06-28 DIAGNOSIS — J330 Polyp of nasal cavity: Secondary | ICD-10-CM | POA: Insufficient documentation

## 2021-06-28 DIAGNOSIS — J3089 Other allergic rhinitis: Secondary | ICD-10-CM | POA: Insufficient documentation

## 2021-06-29 ENCOUNTER — Other Ambulatory Visit: Payer: Self-pay

## 2021-06-29 ENCOUNTER — Ambulatory Visit (INDEPENDENT_AMBULATORY_CARE_PROVIDER_SITE_OTHER): Payer: Self-pay | Admitting: *Deleted

## 2021-06-29 VITALS — Ht 71.0 in | Wt 209.4 lb

## 2021-06-29 DIAGNOSIS — Z1211 Encounter for screening for malignant neoplasm of colon: Secondary | ICD-10-CM

## 2021-06-29 NOTE — Progress Notes (Signed)
Triaged pt and then he informed me that it would be best to have a Jan procedure.  Informed pt that we would just need to update his triage.  Scheduled nurse visit by telephone on 08/15/2021 at 2:00.

## 2021-06-29 NOTE — Progress Notes (Signed)
Gastroenterology Pre-Procedure Review  Request Date: 06/29/2021 Requesting Physician: Valentino Nose, FNP-C, no previous TCS  PATIENT REVIEW QUESTIONS: The patient responded to the following health history questions as indicated:    1. Diabetes Melitis: no 2. Joint replacements in the past 12 months: no 3. Major health problems in the past 3 months: no 4. Has an artificial valve or MVP: no 5. Has a defibrillator: no 6. Has been advised in past to take antibiotics in advance of a procedure like teeth cleaning: no 7. Family history of colon cancer: no  8. Alcohol Use: yes, 4 beers on weekends 9. Illicit drug Use: no 10. History of sleep apnea: no  11. History of coronary artery or other vascular stents placed within the last 12 months: no 12. History of any prior anesthesia complications: no 13. Body mass index is 29.21 kg/m.    MEDICATIONS & ALLERGIES:    Patient reports the following regarding taking any blood thinners:   Plavix? no Aspirin? no Coumadin? no Brilinta? no Xarelto? no Eliquis? no Pradaxa? no Savaysa? no Effient? no  Patient confirms/reports the following medications:  Current Outpatient Medications  Medication Sig Dispense Refill   cetirizine (ZYRTEC) 10 MG tablet Take 10 mg by mouth daily.     nebivolol (BYSTOLIC) 10 MG tablet Take 10 mg by mouth every other day.     No current facility-administered medications for this visit.    Patient confirms/reports the following allergies:  No Known Allergies  No orders of the defined types were placed in this encounter.   AUTHORIZATION INFORMATION Primary Insurance: [place],  [place] #: [id number],  Group #: [id number] Pre-Cert / Josem Kaufmann required:  Pre-Cert / Auth #:    SCHEDULE INFORMATION: Procedure has been scheduled as follows:  Date: , Time:   Location:   This Gastroenterology Pre-Precedure Review Form is being routed to the following provider(s):  Neil Crouch, PA-C

## 2021-07-04 NOTE — Progress Notes (Signed)
Noted  

## 2021-08-01 ENCOUNTER — Telehealth: Payer: Self-pay

## 2021-08-01 ENCOUNTER — Other Ambulatory Visit: Payer: Self-pay

## 2021-08-01 MED ORDER — XHANCE 93 MCG/ACT NA EXHU: 2.0000 | INHALANT_SUSPENSION | Freq: Two times a day (BID) | NASAL | 5 refills | Status: DC

## 2021-08-01 NOTE — Telephone Encounter (Signed)
Patient called stating he would like to move forward with the [facility] Prescription.   [facility] [address]

## 2021-08-01 NOTE — Telephone Encounter (Signed)
Cristian Brown has [facility] to [facility]. Xhance 2 sprays per nostril twice for nasal polyps. I called the patient and left a detailed message that the xhance has been send to a specialty pharmacy called [facility] [phone]). I told the patient to call the office back with any questions.

## 2021-08-01 NOTE — Telephone Encounter (Signed)
Cristian Brown has been [facility] to [facility]. Xhance 2 sprays per nostril twice for nasal polyps. I called the patient and left a detailed message that the xhance has been send to a specialty pharmacy called [facility] [phone]). I told the patient to call the office back with any questions.

## 2021-08-15 ENCOUNTER — Ambulatory Visit (INDEPENDENT_AMBULATORY_CARE_PROVIDER_SITE_OTHER): Payer: Self-pay | Admitting: *Deleted

## 2021-08-15 ENCOUNTER — Other Ambulatory Visit: Payer: Self-pay

## 2021-08-15 DIAGNOSIS — Z1211 Encounter for screening for malignant neoplasm of colon: Secondary | ICD-10-CM

## 2021-08-15 NOTE — Progress Notes (Signed)
OK to schedule. ASA II 

## 2021-08-15 NOTE — Progress Notes (Addendum)
Gastroenterology Pre-Procedure Review   Request Date: 06/29/2021 Requesting Physician: Valentino Nose, FNP-C, no previous TCS   PATIENT REVIEW QUESTIONS: The patient responded to the following health history questions as indicated:     1. Diabetes Melitis: no 2. Joint replacements in the past 12 months: no 3. Major health problems in the past 3 months: no 4. Has an artificial valve or MVP: no 5. Has a defibrillator: no 6. Has been advised in past to take antibiotics in advance of a procedure like teeth cleaning: no 7. Family history of colon cancer: no  8. Alcohol Use: yes, 4 beers on weekends 9. Illicit drug Use: no 10. History of sleep apnea: no  11. History of coronary artery or other vascular stents placed within the last 12 months: no 12. History of any prior anesthesia complications: no 13. Body mass index is 29.21 kg/m.    MEDICATIONS & ALLERGIES:    Patient reports the following regarding taking any blood thinners:   Plavix? no Aspirin? no Coumadin? no Brilinta? no Xarelto? no Eliquis? no Pradaxa? no Savaysa? no Effient? no   Patient confirms/reports the following medications:        Current Outpatient Medications  Medication Sig Dispense Refill   cetirizine (ZYRTEC) 10 MG tablet Take 10 mg by mouth daily.       nebivolol (BYSTOLIC) 10 MG tablet Take 10 mg by mouth every other day.        No current facility-administered medications for this visit.      Patient confirms/reports the following allergies:  No Known Allergies   No orders of the defined types were placed in this encounter.     [place] INFORMATION Primary Insurance: [facility],  [place] #: [id number],  Group #: [id number] Pre-Cert / Auth required: Yes, approved online 10/08/1550-0/80/2233 Pre-Cert / Josem Kaufmann #: [id number]     SCHEDULE INFORMATION: Procedure has been scheduled as follows:  Date: 09/13/2021, Time: 9:00 Location: [facility] with Dr. Abbey Chatters   This Gastroenterology Pre-Precedure Review Form is being  routed to the following provider(s):  Aliene Altes, PA-C

## 2021-08-15 NOTE — Progress Notes (Signed)
Updated triage.  No changes in medical history.

## 2021-08-16 NOTE — Progress Notes (Signed)
Lmom for pt to call me back. 

## 2021-08-17 MED ORDER — NA SULFATE-K SULFATE-MG SULF 17.5-3.13-1.6 GM/177ML PO SOLN: 1.0000 | Freq: Once | ORAL | 0 refills | Status: AC

## 2021-08-17 NOTE — Progress Notes (Signed)
Lmom for pt to call me back. 

## 2021-08-17 NOTE — Addendum Note (Signed)
Addended by: Metro Kung on: 08/17/2021 03:32 PM   Modules accepted: Orders

## 2021-08-18 ENCOUNTER — Encounter: Payer: Self-pay | Admitting: *Deleted

## 2021-08-18 ENCOUNTER — Other Ambulatory Visit: Payer: Self-pay | Admitting: *Deleted

## 2021-08-18 NOTE — Progress Notes (Signed)
Spoke to pt.  Scheduled procedure for 09/13/2021 with arrival at 7:30 am.  Reviewed prep instructions with pt.  Pt made aware that I am mailing out prep instructions.  Confirmed mailing address with pt.

## 2021-09-13 ENCOUNTER — Ambulatory Visit (HOSPITAL_COMMUNITY): Payer: 59 | Admitting: Anesthesiology

## 2021-09-13 ENCOUNTER — Ambulatory Visit (HOSPITAL_COMMUNITY)
Admission: RE | Admit: 2021-09-13 | Discharge: 2021-09-13 | Disposition: A | Discharge status: Home / Self Care | Payer: 59 | Attending: Internal Medicine | Admitting: Internal Medicine

## 2021-09-13 ENCOUNTER — Encounter (HOSPITAL_COMMUNITY): Payer: Self-pay

## 2021-09-13 ENCOUNTER — Encounter (HOSPITAL_COMMUNITY)
Admission: RE | Disposition: A | Discharge status: Home / Self Care | Payer: Self-pay | Source: Home / Self Care | Attending: Internal Medicine

## 2021-09-13 ENCOUNTER — Other Ambulatory Visit: Payer: Self-pay

## 2021-09-13 DIAGNOSIS — Z1211 Encounter for screening for malignant neoplasm of colon: Secondary | ICD-10-CM | POA: Diagnosis not present

## 2021-09-13 DIAGNOSIS — K648 Other hemorrhoids: Secondary | ICD-10-CM | POA: Diagnosis not present

## 2021-09-13 DIAGNOSIS — F17210 Nicotine dependence, cigarettes, uncomplicated: Secondary | ICD-10-CM | POA: Diagnosis not present

## 2021-09-13 DIAGNOSIS — D124 Benign neoplasm of descending colon: Secondary | ICD-10-CM | POA: Diagnosis not present

## 2021-09-13 HISTORY — PX: POLYPECTOMY: SHX5525

## 2021-09-13 HISTORY — PX: COLONOSCOPY WITH PROPOFOL: SHX5780

## 2021-09-13 SURGERY — COLONOSCOPY WITH PROPOFOL
Anesthesia: General

## 2021-09-13 MED ORDER — PROPOFOL 10 MG/ML IV BOLUS
INTRAVENOUS | Status: DC | PRN
  Administered 2021-09-13: 40 mg via INTRAVENOUS
  Administered 2021-09-13: 50 mg via INTRAVENOUS
  Administered 2021-09-13: 100 mg via INTRAVENOUS
  Administered 2021-09-13: 50 mg via INTRAVENOUS

## 2021-09-13 MED ORDER — LIDOCAINE HCL (CARDIAC) PF 100 MG/5ML IV SOSY
PREFILLED_SYRINGE | INTRAVENOUS | Status: DC | PRN
  Administered 2021-09-13: 50 mg via INTRAVENOUS

## 2021-09-13 MED ORDER — LACTATED RINGERS IV SOLN: INTRAVENOUS | Status: DC

## 2021-09-13 NOTE — Discharge Instructions (Addendum)
°  Colonoscopy Discharge Instructions  Read the instructions outlined below and refer to this sheet in the next few weeks. These discharge instructions provide you with general information on caring for yourself after you leave the hospital. Your doctor may also give you specific instructions. While your treatment has been planned according to the most current medical practices available, unavoidable complications occasionally occur.   ACTIVITY You may resume your regular activity, but move at a slower pace for the next 24 hours.  Take frequent rest periods for the next 24 hours.  Walking will help get rid of the air and reduce the bloated feeling in your belly (abdomen).  No driving for 24 hours (because of the medicine (anesthesia) used during the test).   Do not sign any important legal documents or operate any machinery for 24 hours (because of the anesthesia used during the test).  NUTRITION Drink plenty of fluids.  You may resume your normal diet as instructed by your doctor.  Begin with a light meal and progress to your normal diet. Heavy or fried foods are harder to digest and may make you feel sick to your stomach (nauseated).  Avoid alcoholic beverages for 24 hours or as instructed.  MEDICATIONS You may resume your normal medications unless your doctor tells you otherwise.  WHAT YOU CAN EXPECT TODAY Some feelings of bloating in the abdomen.  Passage of more gas than usual.  Spotting of blood in your stool or on the toilet paper.  IF YOU HAD POLYPS REMOVED DURING THE COLONOSCOPY: No aspirin products for 7 days or as instructed.  No alcohol for 7 days or as instructed.  Eat a soft diet for the next 24 hours.  FINDING OUT THE RESULTS OF YOUR TEST Not all test results are available during your visit. If your test results are not back during the visit, make an appointment with your caregiver to find out the results. Do not assume everything is normal if you have not heard from your  caregiver or the medical facility. It is important for you to follow up on all of your test results.  SEEK IMMEDIATE MEDICAL ATTENTION IF: You have more than a spotting of blood in your stool.  Your belly is swollen (abdominal distention).  You are nauseated or vomiting.  You have a temperature over 101.  You have abdominal pain or discomfort that is severe or gets worse throughout the day.   Your colonoscopy revealed 1 polyp(s) which I removed successfully. Await pathology results, my office will contact you. I recommend repeating colonoscopy in 5 years for surveillance purposes. Otherwise follow up with GI as needed.    I hope you have a great rest of your week!  Charles K. Carver, D.O. Gastroenterology and Hepatology [facility]  

## 2021-09-13 NOTE — Anesthesia Postprocedure Evaluation (Signed)
Anesthesia Post Note  Patient: Cristian Brown  Procedure(s) Performed: COLONOSCOPY WITH PROPOFOL POLYPECTOMY  Patient location during evaluation: Endoscopy Anesthesia Type: General Level of consciousness: awake and alert Pain management: pain level controlled Vital Signs Assessment: post-procedure vital signs reviewed and stable Respiratory status: spontaneous breathing, nonlabored ventilation, respiratory function stable and patient connected to nasal cannula oxygen Cardiovascular status: blood pressure returned to baseline and stable Postop Assessment: no apparent nausea or vomiting Anesthetic complications: no   No notable events documented.   Last Vitals:  Vitals:   09/13/21 0725 09/13/21 0909  BP: (!) 148/90 117/67  Resp: 16 17  Temp: 36.7 C 36.4 C  SpO2: 99% 97%    Last Pain:  Vitals:   09/13/21 0909  TempSrc: Axillary  PainSc: 0-No pain                 Trixie Rude

## 2021-09-13 NOTE — Op Note (Signed)
[facility] Patient Name: Cristian Brown Procedure Date: 09/13/2021 7:24 AM MRN: [id number] Date of Birth: 06-27-1971 Attending MD: Elon Alas. Abbey Chatters DO CSN: [id number] Age: 51 Admit Type: Outpatient Procedure:                Colonoscopy Indications:              Screening for colorectal malignant neoplasm Providers:                Elon Alas. Abbey Chatters, DO, Hughie Closs, RN, Lambert Mody, Randa Spike, Technician Referring MD:              Medicines:                See the Anesthesia note for documentation of the                            administered medications Complications:            No immediate complications. Estimated Blood Loss:     Estimated blood loss was minimal. Procedure:                Pre-Anesthesia Assessment:                           - The anesthesia plan was to use monitored                            anesthesia care (MAC).                           After obtaining informed consent, the colonoscope                            was passed under direct vision. Throughout the                            procedure, the patient's blood pressure, pulse, and                            oxygen saturations were monitored continuously. The                            [id number]-HQ190L ([id number]) scope was introduced through                            the anus and advanced to the the cecum, identified                            by appendiceal orifice and ileocecal valve. The                            colonoscopy was performed without difficulty. The                            patient tolerated the procedure  well. The quality                            of the bowel preparation was evaluated using the                            BBPS [facility] Bowel Preparation Scale) with scores                            of: Right Colon = 3, Transverse Colon = 3 and Left                            Colon = 3 (entire mucosa seen well with no residual                             staining, small fragments of stool or opaque                            liquid). The total BBPS score equals 9. Scope In: 8:54:02 AM Scope Out: 9:06:01 AM Scope Withdrawal Time: 0 hours 10 minutes 12 seconds  Total Procedure Duration: 0 hours 11 minutes 59 seconds  Findings:      The perianal and digital rectal examinations were normal.      Non-bleeding internal hemorrhoids were found during endoscopy.      A 10 mm polyp was found in the descending colon. The polyp was       pedunculated. The polyp was removed with a cold snare. Resection and       retrieval were complete.      The exam was otherwise without abnormality. Impression:               - Non-bleeding internal hemorrhoids.                           - One 10 mm polyp in the descending colon, removed                            with a cold snare. Resected and retrieved.                           - The examination was otherwise normal. Moderate Sedation:      Per Anesthesia Care Recommendation:           - Patient has a contact number available for                            emergencies. The signs and symptoms of potential                            delayed complications were discussed with the                            patient. Return to normal activities tomorrow.  Written discharge instructions were provided to the                            patient.                           - Resume previous diet.                           - Continue present medications.                           - Await pathology results.                           - Repeat colonoscopy in 5 years for surveillance.                           - Return to GI clinic PRN. Procedure Code(s):        --- Professional ---                           [phone], Colonoscopy, flexible; with removal of                            tumor(s), polyp(s), or other lesion(s) by snare                            technique Diagnosis Code(s):        ---  Professional ---                           Z12.11, Encounter for screening for malignant                            neoplasm of colon                           K63.5, Polyp of colon                           K64.8, Other hemorrhoids CPT copyright 2019 American Medical Association. All rights reserved. The codes documented in this report are preliminary and upon coder review may  be revised to meet current compliance requirements. Elon Alas. Abbey Chatters, DO [place] Abbey Chatters, DO 09/13/2021 9:08:33 AM This report has been signed electronically. Number of Addenda: 0

## 2021-09-13 NOTE — Anesthesia Preprocedure Evaluation (Signed)
Anesthesia Evaluation  Patient identified by MRN, date of birth, ID band Patient awake    Reviewed: Allergy & Precautions, NPO status , Patient's Chart, lab work & pertinent test results  Airway Mallampati: I  TM Distance: >3 FB Neck ROM: Full    Dental no notable dental hx.    Pulmonary neg pulmonary ROS, Current Smoker,    Pulmonary exam normal        Cardiovascular negative cardio ROS Normal cardiovascular exam     Neuro/Psych negative neurological ROS     GI/Hepatic negative GI ROS, Neg liver ROS,   Endo/Other  negative endocrine ROS  Renal/GU negative Renal ROS     Musculoskeletal negative musculoskeletal ROS (+)   Abdominal Normal abdominal exam  (+)   Peds  Hematology negative hematology ROS (+)   Anesthesia Other Findings   Reproductive/Obstetrics                             Anesthesia Physical Anesthesia Plan  ASA: 2  Anesthesia Plan: General   Post-op Pain Management:    Induction:   PONV Risk Score and Plan: TIVA  Airway Management Planned: Nasal Cannula  Additional Equipment:   Intra-op Plan:   Post-operative Plan:   Informed Consent: I have reviewed the patients History and Physical, chart, labs and discussed the procedure including the risks, benefits and alternatives for the proposed anesthesia with the patient or authorized representative who has indicated his/her understanding and acceptance.       Plan Discussed with: CRNA  Anesthesia Plan Comments:         Anesthesia Quick Evaluation

## 2021-09-13 NOTE — H&P (Signed)
Primary Care Physician:  Celene Squibb, MD Primary Gastroenterologist:  Dr. Abbey Chatters  Pre-Procedure History & Physical: HPI:  Cristian Brown is a 51 y.o. male is here for his first ever colonoscopy for colon cancer screening purposes.  Patient denies any family history of colorectal cancer.  No melena or hematochezia.  No abdominal pain or unintentional weight loss.  No change in bowel habits.  Overall feels well from a GI standpoint.  History reviewed. No pertinent past medical history.  History reviewed. No pertinent surgical history.  Prior to Admission medications   Medication Sig Start Date End Date Taking? Authorizing Provider  cetirizine (ZYRTEC) 10 MG tablet Take 10 mg by mouth daily.   Yes [provider]  Fluticasone Propionate (XHANCE) 93 MCG/ACT EXHU Place 2 sprays into the nose 2 (two) times daily. Patient taking differently: Place 1 spray into the nose daily. 08/01/21  Yes Valentina Shaggy, MD  nebivolol (BYSTOLIC) 10 MG tablet Take 10 mg by mouth every other day. 06/15/21  Yes [provider]    Allergies as of 08/18/2021   (No Known Allergies)    History reviewed. No pertinent family history.  Social History   Socioeconomic History   Marital status: Married    Spouse name: Not on file   Number of children: Not on file   Years of education: Not on file   Highest education level: Not on file  Occupational History   Not on file  Tobacco Use   Smoking status: Every Day    Types: Cigarettes   Smokeless tobacco: Never  Substance and Sexual Activity   Alcohol use: Not on file   Drug use: Not on file   Sexual activity: Not on file  Other Topics Concern   Not on file  Social History Narrative   Not on file   Social Determinants of Health   Financial Resource Strain: Not on file  Food Insecurity: Not on file  Transportation Needs: Not on file  Physical Activity: Not on file  Stress: Not on file  Social Connections: Not on file  Intimate  Partner Violence: Not on file    Review of Systems: See HPI, otherwise negative ROS  Physical Exam: Vital signs in last 24 hours: Temp:  [98.1 F (36.7 C)] 98.1 F (36.7 C) (01/17 0725) Resp:  [16] 16 (01/17 0725) BP: (148)/(90) 148/90 (01/17 0725) SpO2:  [99 %] 99 % (01/17 0725) Weight:  [95.3 kg] 95.3 kg (01/17 0725)   General:   Alert,  Well-developed, well-nourished, pleasant and cooperative in NAD Head:  Normocephalic and atraumatic. Eyes:  Sclera clear, no icterus.   Conjunctiva pink. Ears:  Normal auditory acuity. Nose:  No deformity, discharge,  or lesions. Mouth:  No deformity or lesions, dentition normal. Neck:  Supple; no masses or thyromegaly. Lungs:  Clear throughout to auscultation.   No wheezes, crackles, or rhonchi. No acute distress. Heart:  Regular rate and rhythm; no murmurs, clicks, rubs,  or gallops. Abdomen:  Soft, nontender and nondistended. No masses, hepatosplenomegaly or hernias noted. Normal bowel sounds, without guarding, and without rebound.   Msk:  Symmetrical without gross deformities. Normal posture. Extremities:  Without clubbing or edema. Neurologic:  Alert and  oriented x4;  grossly normal neurologically. Skin:  Intact without significant lesions or rashes. Cervical Nodes:  No significant cervical adenopathy. Psych:  Alert and cooperative. Normal mood and affect.  Impression/Plan: Cristian Brown is here for a colonoscopy to be performed for colon cancer screening purposes.  The risks of the procedure including infection, bleed, or perforation as well as benefits, limitations, alternatives and imponderables have been reviewed with the patient. Questions have been answered. All parties agreeable.

## 2021-09-13 NOTE — Transfer of Care (Signed)
Immediate Anesthesia Transfer of Care Note  Patient: Cristian Brown  Procedure(s) Performed: COLONOSCOPY WITH PROPOFOL POLYPECTOMY  Patient Location: Endoscopy Unit  Anesthesia Type:General  Level of Consciousness: awake  Airway & Oxygen Therapy: Patient Spontanous Breathing  Post-op Assessment: Report given to RN and Post -op Vital signs reviewed and stable  Post vital signs: Reviewed and stable  Last Vitals:  Vitals Value Taken Time  BP    Temp    Pulse    Resp    SpO2      Last Pain:  Vitals:   09/13/21 0850  TempSrc:   PainSc: 0-No pain      Patients Stated Pain Goal: 10 (73/57/89 [id number])  Complications: No notable events documented.

## 2021-09-15 ENCOUNTER — Encounter (HOSPITAL_COMMUNITY): Payer: Self-pay | Admitting: Internal Medicine

## 2021-09-15 LAB — SURGICAL PATHOLOGY

## 2021-09-28 ENCOUNTER — Ambulatory Visit: Payer: 59 | Admitting: Allergy & Immunology

## 2021-09-28 DIAGNOSIS — J309 Allergic rhinitis, unspecified: Secondary | ICD-10-CM

## 2021-10-03 ENCOUNTER — Ambulatory Visit: Payer: 59 | Admitting: Family Medicine

## 2021-10-03 ENCOUNTER — Other Ambulatory Visit: Payer: Self-pay

## 2021-10-03 ENCOUNTER — Encounter: Payer: Self-pay | Admitting: Family Medicine

## 2021-10-03 VITALS — BP 128/74 | HR 65 | Temp 97.1°F | Resp 16 | Ht 71.0 in | Wt 219.2 lb

## 2021-10-03 DIAGNOSIS — Z886 Allergy status to analgesic agent status: Secondary | ICD-10-CM | POA: Diagnosis not present

## 2021-10-03 DIAGNOSIS — J302 Other seasonal allergic rhinitis: Secondary | ICD-10-CM

## 2021-10-03 DIAGNOSIS — J330 Polyp of nasal cavity: Secondary | ICD-10-CM

## 2021-10-03 DIAGNOSIS — J3089 Other allergic rhinitis: Secondary | ICD-10-CM

## 2021-10-03 NOTE — Patient Instructions (Signed)
Allergic rhinitis Continue allergen avoidance measures directed toward pollens, indoor mold, and outdoor mold as listed below Continue cetirizine 10 mg once a day as needed for runny nose or itch.anterior Consider saline nasal rinses as needed for nasal symptoms. Use this before any medicated nasal sprays for best result Consider allergen immunotherapy if your symptoms are not controlled  Nasal polyposis Continue Xhance 2 sprays in each nostril twice a day for nasal polyposis Consider saline nasal rinses as needed for nasal symptoms. Use this before any medicated nasal sprays for best result Begin Dupixent injections for control of nasal polyps  NSAID sensitivity Continue to avoid NSAIDs  Call the clinic if this treatment plan is not working well for you.  Follow up in 3 months or sooner if needed.  Reducing Pollen Exposure The American Academy of Allergy, Asthma and Immunology suggests the following steps to reduce your exposure to pollen during allergy seasons. Do not hang sheets or clothing out to dry; pollen may collect on these items. Do not mow lawns or spend time around freshly cut grass; mowing stirs up pollen. Keep windows closed at night.  Keep car windows closed while driving. Minimize morning activities outdoors, a time when pollen counts are usually at their highest. Stay indoors as much as possible when pollen counts or humidity is high and on windy days when pollen tends to remain in the air longer. Use air conditioning when possible.  Many air conditioners have filters that trap the pollen spores. Use a HEPA room air filter to remove pollen form the indoor air you breathe.  Control of Mold Allergen Mold and fungi can grow on a variety of surfaces provided certain temperature and moisture conditions exist.  Outdoor molds grow on plants, decaying vegetation and soil.  The major outdoor mold, Alternaria and Cladosporium, are found in very high numbers during hot and dry  conditions.  Generally, a late Summer - Fall peak is seen for common outdoor fungal spores.  Rain will temporarily lower outdoor mold spore count, but counts rise rapidly when the rainy period ends.  The most important indoor molds are Aspergillus and Penicillium.  Dark, humid and poorly ventilated basements are ideal sites for mold growth.  The next most common sites of mold growth are the bathroom and the kitchen.  Outdoor [employer] Use air conditioning and keep windows closed Avoid exposure to decaying vegetation. Avoid leaf raking. Avoid grain handling. Consider wearing a face mask if working in moldy areas.  Indoor Mold Control Maintain humidity below 50%. Clean washable surfaces with 5% bleach solution. Remove sources e.g. Contaminated carpets.

## 2021-10-03 NOTE — Progress Notes (Signed)
Cristian Brown, [address] [place] [address] Dept: [phone]  FOLLOW UP NOTE  Patient ID: Cristian Brown, male    DOB: February 19, 1971  Age: 51 y.o. MRN: [id number] Date of Office Visit: 10/03/2021  Assessment  Chief Complaint: Nasal Polyps (3 mth f/u - Left side - no change; right side - a little better)  HPI [name] is a 51 year old male who presents to the clinic for follow-up visit.  He was last seen in this clinic on 06/24/2021 with Dr. Ernst Bowler for evaluation of nasal polyposis, NSAID sensitivity, and allergic rhinitis. At today's visit, he reports his allergic rhinitis has been moderately well controlled with symptoms including constant congestion with the right nostril more affected than the left. He reports occasional clear rhinorrhea and minimal sneezing. He continues cetirizine 10 mg once a day and uses Xhance 2 sprays in each nostril twice a day. His last environmental allergy skin testing was on 06/24/2021 and was positive to grass pollen, weed pollen, tree pollen, and indoor and outdoor molds. He reports 2 previous nasal surgeries for nasal polyposis removal. 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 He can taste food without any issues. He is interested in moving forward with Dupixent at this time. He continues to avoid all NSAIDS and NSAID containing compounds without any accidental ingestion since his last visit to this clinic. His current medications are listed in the chart.   Drug Allergies:  No Known Allergies  Physical Exam: BP 128/74    Pulse 65    Temp (!) 97.1 F (36.2 C)    Resp 16    Ht 5\' 11"  (1.803 m)    Wt 219 lb 3.2 oz (99.4 kg)    SpO2 96%    BMI 30.57 kg/m    Physical Exam Vitals reviewed.  Constitutional:      Appearance: Normal appearance.  HENT:     Head: Normocephalic and atraumatic.     Right Ear: Tympanic membrane normal.     Left Ear: Tympanic membrane normal.     Nose:     Comments: Bilateral nares slightly  erythematous with clear nasal drainage noted. Pharynx normal. Ears normal. Eyes normal.    Mouth/Throat:     Pharynx: Oropharynx is clear.  Eyes:     Conjunctiva/sclera: Conjunctivae normal.  Cardiovascular:     Rate and Rhythm: Normal rate and regular rhythm.     Heart sounds: Normal heart sounds. No murmur heard. Pulmonary:     Effort: Pulmonary effort is normal.     Breath sounds: Normal breath sounds.     Comments: Lungs clear to auscultation Musculoskeletal:        General: Normal range of motion.     Cervical back: Normal range of motion and neck supple.  Skin:    General: Skin is warm and dry.  Neurological:     Mental Status: He is alert and oriented to person, place, and time.  Psychiatric:        Mood and Affect: Mood normal.        Thought Content: Thought content normal.        Judgment: Judgment normal.    Assessment and Plan: 1. Polyp of nasal cavity   2. Seasonal and perennial allergic rhinitis   3. 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 Use this before any medicated nasal sprays for best result Consider allergen immunotherapy if your symptoms are not controlled  Nasal polyposis Continue Xhance 2 sprays in each nostril twice a day for nasal polyposis Consider saline nasal rinses as needed for nasal symptoms. Use this before any medicated nasal sprays for best result Begin Dupixent injections for control of nasal polyps  NSAID sensitivity Continue to avoid NSAIDs  Call the clinic if this treatment plan is not working well for you.  Follow up in 3 months or sooner if needed.   Return in about 3 months (around 12/31/2021), or if symptoms worsen or fail  to improve.    Thank you for the opportunity to care for this patient.  Please do not hesitate to contact me with questions.  Gareth Morgan, FNP [facility]

## 2021-10-04 ENCOUNTER — Telehealth: Payer: Self-pay | Admitting: *Deleted

## 2021-10-04 NOTE — Telephone Encounter (Signed)
L/m for patient to contact me with his prescription card info to try to get Dupixent approval

## 2021-10-04 NOTE — Telephone Encounter (Signed)
-----   Message from Dara Hoyer, FNP sent at 10/03/2021 11:31 AM EST ----- Hi Esterlene Atiyeh, This patient has had a slight improvement with Xhance, however, is interested in beginning Dupixent injections. Can you please submit this patient for Dupixent? Thank you

## 2021-10-12 NOTE — Telephone Encounter (Signed)
Patient called and gave info for Rx card. I advised him I would work on approval and be in touch

## 2021-10-13 ENCOUNTER — Other Ambulatory Visit: Payer: Self-pay | Admitting: *Deleted

## 2021-10-13 MED ORDER — XHANCE 93 MCG/ACT NA EXHU: 2.0000 | INHALANT_SUSPENSION | Freq: Two times a day (BID) | NASAL | 5 refills | Status: DC

## 2021-10-13 NOTE — Telephone Encounter (Signed)
Called patient and advised approval, copay card and submit to [place] for [facility].  Patient wants to have same done in clinic so instructions given to make appt once delivery is set to start therapy

## 2021-11-02 ENCOUNTER — Telehealth: Payer: Self-pay | Admitting: *Deleted

## 2021-11-02 NOTE — Telephone Encounter (Signed)
L/m for patient [place] delivery to [facility] clinic 3/15 and can call and schedule appt for injection after 3/15 ?

## 2021-11-08 ENCOUNTER — Telehealth: Payer: Self-pay

## 2021-11-08 NOTE — Telephone Encounter (Signed)
Patient's Dupixent will be delivered to the [facility] clinic on 11/09/21. Patient would like [place] to call once the shipment has been delivered to set up an appointment for his Dupixent injection.  ? ? ? ?

## 2021-11-14 ENCOUNTER — Other Ambulatory Visit: Payer: Self-pay

## 2021-11-14 ENCOUNTER — Ambulatory Visit (INDEPENDENT_AMBULATORY_CARE_PROVIDER_SITE_OTHER): Payer: 59

## 2021-11-14 DIAGNOSIS — J339 Nasal polyp, unspecified: Secondary | ICD-10-CM | POA: Diagnosis not present

## 2021-11-14 MED ORDER — DUPILUMAB 300 MG/2ML ~~LOC~~ SOSY
300.0000 mg | PREFILLED_SYRINGE | SUBCUTANEOUS | Status: AC
  Administered 2021-11-14 – 2024-06-02 (×65): 300 mg via SUBCUTANEOUS

## 2021-11-14 NOTE — Progress Notes (Signed)
Patient came in today and started his Dupixent injection. We discussed the immunotherapy protocols as well as injection room hours. We scheduled his next appointment in two weeks per his schedule. Patient waited in the lobby for thirty minutes. Patient had already signed consent for his injections. Patient had no other questions or concerns at the time of his appointment. Patient did well with his injection and had no reaction or issue.  ?

## 2021-11-28 ENCOUNTER — Ambulatory Visit (INDEPENDENT_AMBULATORY_CARE_PROVIDER_SITE_OTHER): Payer: 59

## 2021-11-28 DIAGNOSIS — J339 Nasal polyp, unspecified: Secondary | ICD-10-CM | POA: Diagnosis not present

## 2021-12-12 ENCOUNTER — Ambulatory Visit (INDEPENDENT_AMBULATORY_CARE_PROVIDER_SITE_OTHER): Payer: 59

## 2021-12-12 DIAGNOSIS — J339 Nasal polyp, unspecified: Secondary | ICD-10-CM

## 2021-12-28 ENCOUNTER — Encounter: Payer: Self-pay | Admitting: Allergy & Immunology

## 2021-12-28 ENCOUNTER — Ambulatory Visit: Payer: 59 | Admitting: Allergy & Immunology

## 2021-12-28 ENCOUNTER — Ambulatory Visit: Payer: 59

## 2021-12-28 ENCOUNTER — Other Ambulatory Visit: Payer: Self-pay

## 2021-12-28 VITALS — BP 122/82 | HR 70 | Temp 98.1°F | Resp 18

## 2021-12-28 DIAGNOSIS — Z886 Allergy status to analgesic agent status: Secondary | ICD-10-CM | POA: Diagnosis not present

## 2021-12-28 DIAGNOSIS — J3089 Other allergic rhinitis: Secondary | ICD-10-CM | POA: Diagnosis not present

## 2021-12-28 DIAGNOSIS — J339 Nasal polyp, unspecified: Secondary | ICD-10-CM | POA: Diagnosis not present

## 2021-12-28 DIAGNOSIS — J302 Other seasonal allergic rhinitis: Secondary | ICD-10-CM

## 2021-12-28 NOTE — Patient Instructions (Addendum)
1. Polyp of nasal cavity ?- We are not going to continue with Dupixent. ?- We are going to continue with Xhance 2 sprays per nostril twice daily to help decrease the size of these. ?- I am very glad that your insurance covers all these medications. ? ?2. Seasonal and perennial allergic rhinitis ?- Previous testing showed: grasses, weeds, trees, indoor molds, and outdoor molds ?- Continue taking: Zyrtec (cetirizine) '10mg'$  tablet once daily (ESPECIALLY during the pollen season and when you are mowing the lawn) ?- You can use an extra dose of the antihistamine, if needed, for breakthrough symptoms.  ?- Consider nasal saline rinses 1-2 times daily to remove allergens from the nasal cavities as well as help with mucous clearance (this is especially helpful to do before the nasal sprays are given) ?- Consider allergy shots as a means of long-term control. ?- Allergy shots "re-train" and "reset" the immune system to ignore environmental allergens and decrease the resulting immune response to those allergens (sneezing, itchy watery eyes, runny nose, nasal congestion, etc).    ?- Allergy shots improve symptoms in 75-85% of patients.  ?- I think we can hold off on allergy shots for now. ? ?3. Return in about 1 year (around 12/29/2022).  ? ? ?Please inform us of any Emergency Department visits, hospitalizations, or changes in symptoms. Call us before going to the ED for breathing or allergy symptoms since we might be able to fit you in for a sick visit. Feel free to contact us anytime with any questions, problems, or concerns. ? ?It was a pleasure to see you again today! ? ?Websites that have reliable patient information: ?1. American Academy of Asthma, Allergy, and Immunology: www.aaaai.org ?2. Food Allergy Research and Education (FARE): foodallergy.org ?3. Mothers of Asthmatics: http://www.asthmacommunitynetwork.org ?4. [employer] of Allergy, Asthma, and Immunology: MonthlyElectricBill.co.uk ? ? ?COVID-19 Vaccine Information can be  found at: ShippingScam.co.uk For questions related to vaccine distribution or appointments, please email [email or web address] or call [phone].  ? ?We realize that you might be concerned about having an allergic reaction to the COVID19 vaccines. To help with that concern, WE ARE OFFERING THE COVID19 VACCINES IN OUR OFFICE! Ask the front desk for dates!  ? ? ? ??Like? [place] on Facebook and Instagram for our latest updates!  ?  ? ? ?A healthy democracy works best when [employer] participate! Make sure you are registered to vote! If you have moved or changed any of your contact information, you will need to get this updated before voting! ? ?In some cases, you MAY be able to register to vote online: [email or web address] ? ? ? ? ? ? ? ? ? ? ? ? ? ? ? ? ? ?

## 2021-12-28 NOTE — Progress Notes (Signed)
? ?FOLLOW UP ? ?Date of Service/Encounter:  12/28/21 ? ? ?Assessment:  ? ?Polyp of nasal cavity ?  ?NSAID sensitivity - with worsening congestion with the use of them ?  ?Seasonal and perennial allergic rhinitis (grasses, weeds, trees, indoor molds, and outdoor molds) ? ?Plan/Recommendations:  ? ?1. Polyp of nasal cavity ?- We are not going to continue with Dupixent. ?- We are going to continue with Xhance 2 sprays per nostril twice daily to help decrease the size of these. ?- I am very glad that your insurance covers all these medications. ? ?2. Seasonal and perennial allergic rhinitis ?- Previous testing showed: grasses, weeds, trees, indoor molds, and outdoor molds ?- Continue taking: Zyrtec (cetirizine) '10mg'$  tablet once daily (ESPECIALLY during the pollen season and when you are mowing the lawn) ?- You can use an extra dose of the antihistamine, if needed, for breakthrough symptoms.  ?- Consider nasal saline rinses 1-2 times daily to remove allergens from the nasal cavities as well as help with mucous clearance (this is especially helpful to do before the nasal sprays are given) ?- Consider allergy shots as a means of long-term control. ?- Allergy shots "re-train" and "reset" the immune system to ignore environmental allergens and decrease the resulting immune response to those allergens (sneezing, itchy watery eyes, runny nose, nasal congestion, etc).    ?- Allergy shots improve symptoms in 75-85% of patients.  ?- I think we can hold off on allergy shots for now. ? ?3. Return in about 1 year (around 12/29/2022).  ? ?Subjective:  ? ?MANISH RUGGIERO is a 51 y.o. male presenting today for follow up of  ?Chief Complaint  ?Patient presents with  ? Nasal Polyps  ?  A lot better since last visit. Can breath out of both nostrils. Sometimes the left nostril will feel a little congestion.    ? ? ?DAVINDER HAFF has a history of the following: ?Patient Active Problem List  ? Diagnosis Date Noted  ? Sensitivity to  nonsteroidal anti-inflammatory drug (NSAID) 10/03/2021  ? Seasonal and perennial allergic rhinitis 06/28/2021  ? Polyp of nasal cavity 06/28/2021  ? ? ?History obtained from: chart review and patient. ? ?Colyn is a 51 y.o. male presenting for a follow up visit.  He was last seen in February 2023.  At that time, we continued with cetirizine and we discussed allergy shots.  He was continued on Xhance 2 sprays per nostril twice daily and he made the decision to start Sarah Ann for treatment of his nasal polyposis.  We recommended continued avoidance of NSAIDs. ? ?Since last visit, he has done very well.  He reports that he can breathe through his right nostril 100% of the way while his left nostril is about 45 or 50% open.  He feels much better and is getting much better sleep.  He is using the [place] still.  He tells me that this is $0 there is insurance.  His Dupixent injections feel fine.  He gets them in the office.  He does not want to tell me who the best injection giver is!  He has had no sinus infections and has not needed prednisone at all. ? ?Environmental allergies are controlled with Zyrtec which she takes every day.  He feels like this medication is working well to control his symptoms.  He does not feel that he needs allergy shots. ? ?Otherwise, there have been no changes to his past medical history, surgical history, family history, or social history. ? ? ? ?  Review of Systems  ?Constitutional: Negative.  Negative for fever, malaise/fatigue and weight loss.  ?HENT:  Positive for congestion. Negative for ear discharge, ear pain and sinus pain.   ?Eyes:  Negative for pain, discharge and redness.  ?Respiratory:  Positive for stridor. Negative for cough, sputum production, shortness of breath and wheezing.   ?Cardiovascular: Negative.  Negative for chest pain and palpitations.  ?Gastrointestinal:  Negative for abdominal pain, heartburn, nausea and vomiting.  ?Skin: Negative.  Negative for itching and rash.   ?Neurological:  Negative for dizziness and headaches.  ?Endo/Heme/Allergies:  Negative for environmental allergies. Does not bruise/bleed easily.   ? ? ? ?Objective:  ? ?Blood pressure 122/82, pulse 70, temperature 98.1 ?F (36.7 ?C), resp. rate 18, SpO2 97 %. ?There is no height or weight on file to calculate BMI. ? ? ? ?Physical Exam ?Vitals reviewed.  ?Constitutional:   ?   Appearance: He is well-developed.  ?HENT:  ?   Head: Normocephalic and atraumatic.  ?   Comments: Polyps bilaterally are both much smaller than previous evaluations. The polyps are definitely easier to see on the left side, although there is some small amount of sinus polypoid degeneration seen on the right side.  ?   Right Ear: Tympanic membrane, ear canal and external ear normal.  ?   Left Ear: Tympanic membrane, ear canal and external ear normal.  ?   Nose: No nasal deformity, septal deviation, mucosal edema or rhinorrhea.  ?   Right Turbinates: Not enlarged or swollen.  ?   Left Turbinates: Not enlarged or swollen.  ?   Right Sinus: No maxillary sinus tenderness or frontal sinus tenderness.  ?   Left Sinus: No maxillary sinus tenderness or frontal sinus tenderness.  ?   Mouth/Throat:  ?   Mouth: Mucous membranes are not pale and not dry.  ?   Pharynx: Uvula midline.  ?Eyes:  ?   General: Lids are normal. No allergic shiner.    ?   Right eye: No discharge.     ?   Left eye: No discharge.  ?   Conjunctiva/sclera: Conjunctivae normal.  ?   Right eye: Right conjunctiva is not injected. No chemosis. ?   Left eye: Left conjunctiva is not injected. No chemosis. ?   Pupils: Pupils are equal, round, and reactive to light.  ?Cardiovascular:  ?   Rate and Rhythm: Normal rate and regular rhythm.  ?   Heart sounds: Normal heart sounds.  ?Pulmonary:  ?   Effort: Pulmonary effort is normal. No tachypnea, accessory muscle usage or respiratory distress.  ?   Breath sounds: Normal breath sounds. No wheezing, rhonchi or rales.  ?Chest:  ?   Chest wall: No  tenderness.  ?Lymphadenopathy:  ?   Cervical: No cervical adenopathy.  ?Skin: ?   Coloration: Skin is not pale.  ?   Findings: No abrasion, erythema, petechiae or rash. Rash is not papular, urticarial or vesicular.  ?Neurological:  ?   Mental Status: He is alert.  ?Psychiatric:     ?   Behavior: Behavior is cooperative.  ?  ? ?Diagnostic studies: none ? ? ? ?  ?Salvatore Marvel, MD  ?[facility] ? ? ? ? ? ? ?

## 2021-12-30 ENCOUNTER — Ambulatory Visit: Payer: 59

## 2021-12-30 MED ORDER — XHANCE 93 MCG/ACT NA EXHU: 2.0000 | INHALANT_SUSPENSION | Freq: Two times a day (BID) | NASAL | 5 refills | Status: DC

## 2021-12-30 NOTE — Addendum Note (Signed)
Addended by: Larence Penning on: 12/30/2021 12:24 PM ? ? Modules accepted: Orders ? ?

## 2022-01-11 ENCOUNTER — Ambulatory Visit (INDEPENDENT_AMBULATORY_CARE_PROVIDER_SITE_OTHER): Payer: 59

## 2022-01-11 DIAGNOSIS — J339 Nasal polyp, unspecified: Secondary | ICD-10-CM | POA: Diagnosis not present

## 2022-01-30 ENCOUNTER — Ambulatory Visit: Payer: 59

## 2022-02-10 ENCOUNTER — Ambulatory Visit (INDEPENDENT_AMBULATORY_CARE_PROVIDER_SITE_OTHER): Payer: 59

## 2022-02-10 DIAGNOSIS — J339 Nasal polyp, unspecified: Secondary | ICD-10-CM | POA: Diagnosis not present

## 2022-02-24 ENCOUNTER — Ambulatory Visit (INDEPENDENT_AMBULATORY_CARE_PROVIDER_SITE_OTHER): Payer: 59

## 2022-02-24 DIAGNOSIS — J339 Nasal polyp, unspecified: Secondary | ICD-10-CM | POA: Diagnosis not present

## 2022-03-10 ENCOUNTER — Ambulatory Visit (INDEPENDENT_AMBULATORY_CARE_PROVIDER_SITE_OTHER): Payer: 59

## 2022-03-10 DIAGNOSIS — J339 Nasal polyp, unspecified: Secondary | ICD-10-CM

## 2022-03-27 ENCOUNTER — Ambulatory Visit (INDEPENDENT_AMBULATORY_CARE_PROVIDER_SITE_OTHER): Payer: 59

## 2022-03-27 DIAGNOSIS — J339 Nasal polyp, unspecified: Secondary | ICD-10-CM

## 2022-04-10 ENCOUNTER — Ambulatory Visit (INDEPENDENT_AMBULATORY_CARE_PROVIDER_SITE_OTHER): Payer: 59

## 2022-04-10 DIAGNOSIS — J339 Nasal polyp, unspecified: Secondary | ICD-10-CM | POA: Diagnosis not present

## 2022-04-24 ENCOUNTER — Ambulatory Visit (INDEPENDENT_AMBULATORY_CARE_PROVIDER_SITE_OTHER): Payer: 59

## 2022-04-24 DIAGNOSIS — J339 Nasal polyp, unspecified: Secondary | ICD-10-CM

## 2022-05-08 ENCOUNTER — Ambulatory Visit (INDEPENDENT_AMBULATORY_CARE_PROVIDER_SITE_OTHER): Payer: 59 | Admitting: *Deleted

## 2022-05-08 DIAGNOSIS — J339 Nasal polyp, unspecified: Secondary | ICD-10-CM | POA: Diagnosis not present

## 2022-05-22 ENCOUNTER — Ambulatory Visit (INDEPENDENT_AMBULATORY_CARE_PROVIDER_SITE_OTHER): Payer: 59

## 2022-05-22 DIAGNOSIS — J339 Nasal polyp, unspecified: Secondary | ICD-10-CM

## 2022-06-05 ENCOUNTER — Ambulatory Visit (INDEPENDENT_AMBULATORY_CARE_PROVIDER_SITE_OTHER): Payer: 59

## 2022-06-05 DIAGNOSIS — J339 Nasal polyp, unspecified: Secondary | ICD-10-CM

## 2022-06-19 ENCOUNTER — Ambulatory Visit (INDEPENDENT_AMBULATORY_CARE_PROVIDER_SITE_OTHER): Payer: 59 | Admitting: *Deleted

## 2022-06-19 DIAGNOSIS — J339 Nasal polyp, unspecified: Secondary | ICD-10-CM | POA: Diagnosis not present

## 2022-07-03 ENCOUNTER — Ambulatory Visit (INDEPENDENT_AMBULATORY_CARE_PROVIDER_SITE_OTHER): Payer: 59 | Admitting: *Deleted

## 2022-07-03 DIAGNOSIS — J339 Nasal polyp, unspecified: Secondary | ICD-10-CM

## 2022-07-17 ENCOUNTER — Ambulatory Visit (INDEPENDENT_AMBULATORY_CARE_PROVIDER_SITE_OTHER): Payer: 59

## 2022-07-17 DIAGNOSIS — J339 Nasal polyp, unspecified: Secondary | ICD-10-CM | POA: Diagnosis not present

## 2022-07-31 ENCOUNTER — Ambulatory Visit (INDEPENDENT_AMBULATORY_CARE_PROVIDER_SITE_OTHER): Payer: 59

## 2022-07-31 DIAGNOSIS — J339 Nasal polyp, unspecified: Secondary | ICD-10-CM | POA: Diagnosis not present

## 2022-08-14 ENCOUNTER — Ambulatory Visit (INDEPENDENT_AMBULATORY_CARE_PROVIDER_SITE_OTHER): Payer: 59

## 2022-08-14 DIAGNOSIS — J339 Nasal polyp, unspecified: Secondary | ICD-10-CM

## 2022-08-30 ENCOUNTER — Ambulatory Visit (INDEPENDENT_AMBULATORY_CARE_PROVIDER_SITE_OTHER): Payer: 59

## 2022-08-30 DIAGNOSIS — J339 Nasal polyp, unspecified: Secondary | ICD-10-CM

## 2022-09-11 ENCOUNTER — Ambulatory Visit (INDEPENDENT_AMBULATORY_CARE_PROVIDER_SITE_OTHER): Payer: 59

## 2022-09-11 DIAGNOSIS — J339 Nasal polyp, unspecified: Secondary | ICD-10-CM | POA: Diagnosis not present

## 2022-09-25 ENCOUNTER — Ambulatory Visit (INDEPENDENT_AMBULATORY_CARE_PROVIDER_SITE_OTHER): Payer: 59

## 2022-09-25 DIAGNOSIS — J339 Nasal polyp, unspecified: Secondary | ICD-10-CM

## 2022-10-09 ENCOUNTER — Ambulatory Visit: Payer: 59

## 2022-10-11 ENCOUNTER — Ambulatory Visit (INDEPENDENT_AMBULATORY_CARE_PROVIDER_SITE_OTHER): Payer: 59

## 2022-10-11 DIAGNOSIS — J339 Nasal polyp, unspecified: Secondary | ICD-10-CM | POA: Diagnosis not present

## 2022-10-23 ENCOUNTER — Ambulatory Visit (INDEPENDENT_AMBULATORY_CARE_PROVIDER_SITE_OTHER): Payer: 59

## 2022-10-23 DIAGNOSIS — J339 Nasal polyp, unspecified: Secondary | ICD-10-CM

## 2022-11-06 ENCOUNTER — Ambulatory Visit (INDEPENDENT_AMBULATORY_CARE_PROVIDER_SITE_OTHER): Payer: 59

## 2022-11-06 DIAGNOSIS — J339 Nasal polyp, unspecified: Secondary | ICD-10-CM | POA: Diagnosis not present

## 2022-11-20 ENCOUNTER — Ambulatory Visit (INDEPENDENT_AMBULATORY_CARE_PROVIDER_SITE_OTHER): Payer: 59

## 2022-11-20 DIAGNOSIS — J339 Nasal polyp, unspecified: Secondary | ICD-10-CM

## 2022-12-04 ENCOUNTER — Ambulatory Visit (INDEPENDENT_AMBULATORY_CARE_PROVIDER_SITE_OTHER): Payer: 59

## 2022-12-04 DIAGNOSIS — J339 Nasal polyp, unspecified: Secondary | ICD-10-CM | POA: Diagnosis not present

## 2022-12-18 ENCOUNTER — Ambulatory Visit (INDEPENDENT_AMBULATORY_CARE_PROVIDER_SITE_OTHER): Payer: 59

## 2022-12-18 DIAGNOSIS — J339 Nasal polyp, unspecified: Secondary | ICD-10-CM | POA: Diagnosis not present

## 2023-01-01 ENCOUNTER — Other Ambulatory Visit: Payer: Self-pay

## 2023-01-01 ENCOUNTER — Encounter: Payer: Self-pay | Admitting: Internal Medicine

## 2023-01-01 ENCOUNTER — Ambulatory Visit: Payer: 59 | Admitting: Internal Medicine

## 2023-01-01 ENCOUNTER — Ambulatory Visit: Payer: 59

## 2023-01-01 VITALS — BP 138/82 | HR 76 | Temp 97.8°F | Resp 18 | Ht 72.0 in | Wt 231.4 lb

## 2023-01-01 DIAGNOSIS — J3089 Other allergic rhinitis: Secondary | ICD-10-CM | POA: Diagnosis not present

## 2023-01-01 DIAGNOSIS — J339 Nasal polyp, unspecified: Secondary | ICD-10-CM

## 2023-01-01 DIAGNOSIS — J302 Other seasonal allergic rhinitis: Secondary | ICD-10-CM | POA: Diagnosis not present

## 2023-01-01 MED ORDER — CETIRIZINE HCL 10 MG PO TABS: 10.0000 mg | ORAL_TABLET | Freq: Every day | ORAL | 11 refills | Status: DC

## 2023-01-01 MED ORDER — XHANCE 93 MCG/ACT NA EXHU: 2.0000 | INHALANT_SUSPENSION | Freq: Two times a day (BID) | NASAL | 11 refills | Status: DC

## 2023-01-01 NOTE — Progress Notes (Signed)
   FOLLOW UP Date of Service/Encounter:  01/01/23   Subjective:  Cristian Brown (DOB: 1971-05-27) is a 52 y.o. male [facility] for follow up for allergic rhinitis and nasal polyps.   History obtained from: chart review and patient. Last visit was with Dr. Dellis Anes 12/2021 and was doing well on Dupixent/Xhance for nasal polyps and on Zyrtec for allergic rhinitis.   Since last visit, he reports doing very well.  He has not had much nasal symptoms, sometimes has congestion in L nostril but overall he feels like he can breathe well through both nostrils since being on Dupixent and Xhance.  Sense of smell is also doing well.  No prednisone or sinus infections since last visit.  Has had trouble with [facility] shipment.  No issues with Dupixent shots.   Past Medical History: History reviewed. No pertinent past medical history.  Objective:  BP 138/82   Pulse 76   Temp 97.8 F (36.6 C)   Resp 18   Ht 6' (1.829 m)   Wt 231 lb 6 oz (105 kg)   SpO2 97%   BMI 31.38 kg/m  Body mass index is 31.38 kg/m. Physical Exam: GEN: alert, well developed HEENT: clear conjunctiva, TM grey and translucent, nose with moderate inferior turbinate hypertrophy, pink nasal mucosa, clear rhinorrhea, no cobblestoning, sight polypoid edema on L side but I dont see any clear large polyps HEART: regular rate and rhythm, no murmur LUNGS: clear to auscultation bilaterally, no coughing, unlabored respiration SKIN: no rashes or lesions  Assessment:   1. Nose polyp   2. Seasonal and perennial allergic rhinitis     Plan/Recommendations:   1. Nasal Polyps  - Well controlled.  - Continue Dupixent 300mg  every 2 weeks.  - Continue with Xhance 1-2 sprays per nostril twice daily.  If not covered, start Flonase 2 sprays each nostril daily. Aim upward and outward.    2. Seasonal and perennial allergic rhinitis - Well controlled  - SPT 05/2021: positive to grasses, weeds,  trees, indoor molds, and outdoor molds - Continue taking: Zyrtec (cetirizine) 10mg  tablet once daily  - You can use an extra dose of the antihistamine, if needed, for breakthrough symptoms.  - Consider nasal saline rinses 1-2 times daily to remove allergens from the nasal cavities as well as help with mucous clearance (this is especially helpful to do before the nasal sprays are given) - Consider allergy shots as a means of long-term control.                 Return in about 1 year (around 01/01/2024).  Alesia Morin, MD [facility]

## 2023-01-01 NOTE — Patient Instructions (Addendum)
1. Nasal Polyps  - Continue Dupixent every 2 weeks.  - Continue with Xhance 1-2 sprays per nostril twice daily.  If not covered, start Flonase 2 sprays each nostril daily. Aim upward and outward.    2. Seasonal and perennial allergic rhinitis - Previous testing showed: grasses, weeds, trees, indoor molds, and outdoor molds - Continue taking: Zyrtec (cetirizine) 10mg  tablet once daily  - You can use an extra dose of the antihistamine, if needed, for breakthrough symptoms.  - Consider nasal saline rinses 1-2 times daily to remove allergens from the nasal cavities as well as help with mucous clearance (this is especially helpful to do before the nasal sprays are given) - Consider allergy shots as a means of long-term control.

## 2023-01-17 ENCOUNTER — Ambulatory Visit (INDEPENDENT_AMBULATORY_CARE_PROVIDER_SITE_OTHER): Payer: 59

## 2023-01-17 DIAGNOSIS — J339 Nasal polyp, unspecified: Secondary | ICD-10-CM | POA: Diagnosis not present

## 2023-01-29 ENCOUNTER — Ambulatory Visit (INDEPENDENT_AMBULATORY_CARE_PROVIDER_SITE_OTHER): Payer: 59

## 2023-01-29 DIAGNOSIS — J339 Nasal polyp, unspecified: Secondary | ICD-10-CM

## 2023-02-12 ENCOUNTER — Ambulatory Visit: Payer: 59

## 2023-02-14 ENCOUNTER — Ambulatory Visit (INDEPENDENT_AMBULATORY_CARE_PROVIDER_SITE_OTHER): Payer: 59

## 2023-02-14 DIAGNOSIS — J339 Nasal polyp, unspecified: Secondary | ICD-10-CM | POA: Diagnosis not present

## 2023-02-28 ENCOUNTER — Ambulatory Visit (INDEPENDENT_AMBULATORY_CARE_PROVIDER_SITE_OTHER): Payer: 59

## 2023-02-28 DIAGNOSIS — J339 Nasal polyp, unspecified: Secondary | ICD-10-CM | POA: Diagnosis not present

## 2023-03-12 ENCOUNTER — Ambulatory Visit: Payer: 59

## 2023-03-14 ENCOUNTER — Ambulatory Visit (INDEPENDENT_AMBULATORY_CARE_PROVIDER_SITE_OTHER): Payer: 59

## 2023-03-14 DIAGNOSIS — J339 Nasal polyp, unspecified: Secondary | ICD-10-CM | POA: Diagnosis not present

## 2023-03-28 ENCOUNTER — Ambulatory Visit (INDEPENDENT_AMBULATORY_CARE_PROVIDER_SITE_OTHER): Payer: 59

## 2023-03-28 DIAGNOSIS — J339 Nasal polyp, unspecified: Secondary | ICD-10-CM | POA: Diagnosis not present

## 2023-04-11 ENCOUNTER — Ambulatory Visit (INDEPENDENT_AMBULATORY_CARE_PROVIDER_SITE_OTHER): Payer: 59

## 2023-04-11 DIAGNOSIS — J339 Nasal polyp, unspecified: Secondary | ICD-10-CM | POA: Diagnosis not present

## 2023-04-25 ENCOUNTER — Ambulatory Visit: Payer: 59

## 2023-04-25 DIAGNOSIS — J339 Nasal polyp, unspecified: Secondary | ICD-10-CM | POA: Diagnosis not present

## 2023-05-09 ENCOUNTER — Ambulatory Visit (INDEPENDENT_AMBULATORY_CARE_PROVIDER_SITE_OTHER): Payer: 59

## 2023-05-09 DIAGNOSIS — J339 Nasal polyp, unspecified: Secondary | ICD-10-CM | POA: Diagnosis not present

## 2023-05-21 ENCOUNTER — Other Ambulatory Visit (HOSPITAL_COMMUNITY): Payer: Self-pay | Admitting: Family Medicine

## 2023-05-21 ENCOUNTER — Ambulatory Visit (INDEPENDENT_AMBULATORY_CARE_PROVIDER_SITE_OTHER): Payer: 59

## 2023-05-21 DIAGNOSIS — J339 Nasal polyp, unspecified: Secondary | ICD-10-CM

## 2023-05-21 DIAGNOSIS — M25562 Pain in left knee: Secondary | ICD-10-CM

## 2023-06-04 ENCOUNTER — Ambulatory Visit: Payer: 59

## 2023-06-04 DIAGNOSIS — J339 Nasal polyp, unspecified: Secondary | ICD-10-CM

## 2023-06-18 ENCOUNTER — Ambulatory Visit (INDEPENDENT_AMBULATORY_CARE_PROVIDER_SITE_OTHER): Payer: 59

## 2023-06-18 DIAGNOSIS — J339 Nasal polyp, unspecified: Secondary | ICD-10-CM | POA: Diagnosis not present

## 2023-07-02 ENCOUNTER — Ambulatory Visit: Payer: 59

## 2023-07-02 DIAGNOSIS — J339 Nasal polyp, unspecified: Secondary | ICD-10-CM

## 2023-07-16 ENCOUNTER — Ambulatory Visit: Payer: 59

## 2023-07-18 ENCOUNTER — Ambulatory Visit: Payer: 59

## 2023-07-18 DIAGNOSIS — J339 Nasal polyp, unspecified: Secondary | ICD-10-CM

## 2023-07-30 ENCOUNTER — Ambulatory Visit: Payer: 59

## 2023-07-30 DIAGNOSIS — J339 Nasal polyp, unspecified: Secondary | ICD-10-CM

## 2023-08-13 ENCOUNTER — Ambulatory Visit: Payer: 59

## 2023-08-13 DIAGNOSIS — J339 Nasal polyp, unspecified: Secondary | ICD-10-CM

## 2023-08-27 ENCOUNTER — Ambulatory Visit (INDEPENDENT_AMBULATORY_CARE_PROVIDER_SITE_OTHER): Payer: 59

## 2023-08-27 DIAGNOSIS — J339 Nasal polyp, unspecified: Secondary | ICD-10-CM

## 2023-09-10 ENCOUNTER — Ambulatory Visit: Payer: 59

## 2023-09-10 DIAGNOSIS — J339 Nasal polyp, unspecified: Secondary | ICD-10-CM

## 2023-09-19 ENCOUNTER — Other Ambulatory Visit: Payer: Self-pay | Admitting: Family Medicine

## 2023-09-21 ENCOUNTER — Encounter (HOSPITAL_COMMUNITY): Payer: Self-pay | Admitting: Nurse Practitioner

## 2023-09-21 ENCOUNTER — Other Ambulatory Visit (HOSPITAL_COMMUNITY): Payer: Self-pay | Admitting: Nurse Practitioner

## 2023-09-21 DIAGNOSIS — M25561 Pain in right knee: Secondary | ICD-10-CM

## 2023-09-21 DIAGNOSIS — M25562 Pain in left knee: Secondary | ICD-10-CM

## 2023-09-23 ENCOUNTER — Ambulatory Visit (HOSPITAL_COMMUNITY)
Admission: RE | Admit: 2023-09-23 | Discharge: 2023-09-23 | Disposition: A | Discharge status: Home / Self Care | Payer: 59 | Source: Ambulatory Visit | Attending: Nurse Practitioner | Admitting: Nurse Practitioner

## 2023-09-23 DIAGNOSIS — M25561 Pain in right knee: Secondary | ICD-10-CM | POA: Insufficient documentation

## 2023-09-23 DIAGNOSIS — M25562 Pain in left knee: Secondary | ICD-10-CM | POA: Diagnosis present

## 2023-09-24 ENCOUNTER — Ambulatory Visit (INDEPENDENT_AMBULATORY_CARE_PROVIDER_SITE_OTHER): Payer: 59

## 2023-09-24 DIAGNOSIS — J339 Nasal polyp, unspecified: Secondary | ICD-10-CM | POA: Diagnosis not present

## 2023-10-08 ENCOUNTER — Ambulatory Visit (INDEPENDENT_AMBULATORY_CARE_PROVIDER_SITE_OTHER): Payer: 59

## 2023-10-08 DIAGNOSIS — J339 Nasal polyp, unspecified: Secondary | ICD-10-CM

## 2023-10-29 ENCOUNTER — Ambulatory Visit: Payer: 59

## 2023-10-29 DIAGNOSIS — J339 Nasal polyp, unspecified: Secondary | ICD-10-CM

## 2023-11-05 NOTE — Progress Notes (Signed)
 11/06/2023 7:34 PM   Cristian Brown 1971-07-30 [id number]  Referring provider: Benita Stabile, MD [address] [place],  [place] [address]  No chief complaint on file.   HPI: 53 year old male referred by Dr. Dwana Melena for evaluation and management of erectile dysfunction.  On referral sheet, there is mention of him having hypogonadism as well as an elevated PSA.  The patient has multiple medical issues hyperlipidemia, obesity, hypertension, psoriasis, prediabetes.  He states that within the past 3 years he has had problems with both obtaining and maintaining erections.  He was tried on Viagra which caused headaches.  He is up to 20 mg of Cialis.  About 40% of the time he states it is difficult to keep an erection during intercourse.  This is quite troubling to the patient and his wife.  The patient states that he has had a normal PSA.  He does not think he has been told that he had a low testosterone level.  PMH: No past medical history on file.  Surgical History: Past Surgical History:  Procedure Laterality Date   COLONOSCOPY WITH PROPOFOL N/A 09/13/2021   Procedure: COLONOSCOPY WITH PROPOFOL;  Surgeon: Lanelle Bal, DO;  [facility];  Service: Endoscopy;  Laterality: N/A;  9:00 / ASA II   POLYPECTOMY  09/13/2021   Procedure: POLYPECTOMY;  Surgeon: Lanelle Bal, DO;  Location: [facility];  Service: Endoscopy;;    Home Medications:  Allergies as of 11/06/2023   No Known Allergies      Medication List        Accurate as of November 05, 2023  7:34 PM. If you have any questions, ask your nurse or doctor.          cetirizine 10 MG tablet Commonly known as: ZyrTEC Allergy Take 1 tablet (10 mg total) by mouth daily.   Dupixent 300 MG/2ML Soaj Generic drug: Dupilumab Inject into the skin.   Dupixent 300 MG/2ML prefilled syringe Generic drug: dupilumab INJECT 1 SYRINGE UNDER THE SKIN EVERY 14 DAYS   nebivolol 10 MG tablet Commonly known as:  BYSTOLIC Take 10 mg by mouth every other day.   tadalafil 10 MG tablet Commonly known as: CIALIS SMARTSIG:1 Tablet(s) By Mouth   Xhance 93 MCG/ACT Exhu Generic drug: Fluticasone Propionate Place 2 sprays into the nose 2 (two) times daily.        Allergies: No Known Allergies  Family History: No family history on file.  Social History:  reports that he has been smoking cigarettes. He has never used smokeless tobacco. No history on file for alcohol use and drug use.  ROS: All other review of systems were reviewed and are negative except what is noted above in HPI  Physical Exam: There were no vitals taken for this visit.  Constitutional:  Alert and oriented, No acute distress. HEENT:  AT, moist mucus membranes.  Trachea midline, no masses. Cardiovascular: No clubbing, cyanosis, or edema. Respiratory: Normal respiratory effort, no increased work of breathing. GI: No inguinal hernias GU: No CVA tenderness. Circumcised phallus. No masses/lesions on penis, testis, scrotum.  Lymph: No cervical or inguinal lymphadenopathy. Skin: No rashes, bruises or suspicious lesions. Neurologic: Grossly intact, no focal deficits, moving all 4 extremities. Psychiatric: Normal mood and affect.  Laboratory Data: Prior PCP notes reviewed  Prior CBC/CMP reviewed  Currently, I did not see any testosterone or PSA results  IPSS sheet reviewed-score 1/1   Pertinent Imaging: No new imaging available  Assessment:  Erectile dysfunction, currently not adequately treated with PDE 5 inhibitors  Plan: I discussed treatment options with him-using a constriction band with Cialis, learning injections with prostaglandin, vacuum pump as well as IPP placement  I favor trying to use a constriction band at this point.  He is fine with that.  If this does not work in conjunction with the Cialis, he will call for an appointment down the road, and before the appointment, I will send in a prescription for  compounded prostaglandin E1 to begin injection therapy.  No follow-ups on file.  Chelsea Aus, MD  [facility] [place]

## 2023-11-06 ENCOUNTER — Ambulatory Visit: Payer: 59 | Admitting: Urology

## 2023-11-06 ENCOUNTER — Encounter: Payer: Self-pay | Admitting: Urology

## 2023-11-06 VITALS — BP 131/68 | HR 62

## 2023-11-06 DIAGNOSIS — N5201 Erectile dysfunction due to arterial insufficiency: Secondary | ICD-10-CM | POA: Diagnosis not present

## 2023-11-12 ENCOUNTER — Ambulatory Visit

## 2023-11-12 DIAGNOSIS — J339 Nasal polyp, unspecified: Secondary | ICD-10-CM | POA: Diagnosis not present

## 2023-11-26 ENCOUNTER — Ambulatory Visit (INDEPENDENT_AMBULATORY_CARE_PROVIDER_SITE_OTHER)

## 2023-11-26 DIAGNOSIS — J339 Nasal polyp, unspecified: Secondary | ICD-10-CM

## 2023-12-10 ENCOUNTER — Ambulatory Visit

## 2023-12-12 ENCOUNTER — Ambulatory Visit

## 2023-12-12 DIAGNOSIS — J339 Nasal polyp, unspecified: Secondary | ICD-10-CM | POA: Diagnosis not present

## 2023-12-24 ENCOUNTER — Ambulatory Visit (INDEPENDENT_AMBULATORY_CARE_PROVIDER_SITE_OTHER)

## 2023-12-24 DIAGNOSIS — J339 Nasal polyp, unspecified: Secondary | ICD-10-CM

## 2024-01-02 ENCOUNTER — Ambulatory Visit: Payer: 59 | Admitting: Allergy & Immunology

## 2024-01-07 ENCOUNTER — Ambulatory Visit

## 2024-01-09 ENCOUNTER — Ambulatory Visit

## 2024-01-09 ENCOUNTER — Encounter: Payer: Self-pay | Admitting: Allergy & Immunology

## 2024-01-09 ENCOUNTER — Other Ambulatory Visit: Payer: Self-pay

## 2024-01-09 ENCOUNTER — Ambulatory Visit: Admitting: Allergy & Immunology

## 2024-01-09 VITALS — BP 130/80 | HR 51 | Temp 97.7°F | Resp 18 | Ht 71.26 in | Wt 211.2 lb

## 2024-01-09 DIAGNOSIS — J302 Other seasonal allergic rhinitis: Secondary | ICD-10-CM | POA: Diagnosis not present

## 2024-01-09 DIAGNOSIS — J3089 Other allergic rhinitis: Secondary | ICD-10-CM | POA: Diagnosis not present

## 2024-01-09 DIAGNOSIS — J339 Nasal polyp, unspecified: Secondary | ICD-10-CM | POA: Diagnosis not present

## 2024-01-09 MED ORDER — CETIRIZINE HCL 10 MG PO TABS: 10.0000 mg | ORAL_TABLET | Freq: Every day | ORAL | 4 refills | Status: AC

## 2024-01-09 MED ORDER — XHANCE 93 MCG/ACT NA EXHU: 2.0000 | INHALANT_SUSPENSION | Freq: Two times a day (BID) | NASAL | 11 refills | Status: AC

## 2024-01-09 NOTE — Progress Notes (Signed)
 FOLLOW UP  Date of Service/Encounter:  01/09/24   Assessment:   Polyp of nasal cavity   NSAID sensitivity - with worsening congestion with the use of them   Seasonal and perennial allergic rhinitis (grasses, weeds, trees, indoor molds, and outdoor molds)  Plan/Recommendations:   1. Nasal Polyps  - Continue Dupixent  every 2 weeks.  - Continue with Xhance  1-2 sprays per nostril up to twice daily.    2. Seasonal and perennial allergic rhinitis - Previous testing showed: grasses, weeds, trees, indoor molds, and outdoor molds - Continue taking: Zyrtec  (cetirizine ) 10mg  tablet once daily  - You can use an extra dose of the antihistamine, if needed, for breakthrough symptoms.  - Consider nasal saline rinses 1-2 times daily to remove allergens from the nasal cavities as well as help with mucous clearance (this is especially helpful to do before the nasal sprays are given).   3. Return in about 1 year (around 01/08/2025). You can have the follow up appointment with Dr. Idolina Maker or a Nurse Practicioner (our Nurse Practitioners are excellent and always have Physician oversight!).   Subjective:   Cristian Brown is a 53 y.o. male presenting today for follow up of  Chief Complaint  Patient presents with   Follow-up   Sinus Problem    Yearly follow-up    Cristian Brown has a history of the following: Patient Active Problem List   Diagnosis Date Noted   Sensitivity to nonsteroidal anti-inflammatory drug (NSAID) 10/03/2021   Seasonal and perennial allergic rhinitis 06/28/2021   Polyp of nasal cavity 06/28/2021    History obtained from: chart review and patient.  Discussed the use of AI scribe software for clinical note transcription with the patient and/or guardian, who gave verbal consent to proceed.  Cristian Brown is a 53 y.o. male presenting for a follow up visit.  He was last seen in May 2020 for.  At that time, he was continued on Zyrtec .  Allergy  shots were discussed for long-term  control of his allergic rhinitis symptoms.  He remained on Dupixent  300 mg every 2 weeks as well as Xhance .  He was doing very well on the Dupixent .  Since last visit, he has done very well.  Allergic Rhinitis Symptom History: He reports no issues with environmental allergies recently and no sinus infections. He uses Zyrtec  regularly due to sinus issues. He mentions that he has not received Xhance  in about a month and believes there might be stock issues.  He feels that the Xhance  is still well covered.  He does not use it every day, but as often as he can remember.  He has not had any more sinus surgeries.  He had 2 before I met him.  Nasal polyps are currently well-managed, and he is breathing well. He has not experienced any recent sinus infections. He uses Xhance  daily, although occasionally forgets due to a busy work schedule. He also uses Dupixent , which is covered by his insurance, and receives two doses every month. He has not required antibiotics for a while.   He is not a smoker, despite working in a [employer], and does not take the free cigarettes offered by his workplace.  Otherwise, there have been no changes to his past medical history, surgical history, family history, or social history.    Review of systems otherwise negative other than that mentioned in the HPI.    Objective:   Blood pressure 130/80, pulse (!) 51, temperature 97.7 F (36.5 C), temperature  source Temporal, resp. rate 18, height 5' 11.26" (1.81 m), weight 211 lb 3.2 oz (95.8 kg), SpO2 95%. Body mass index is 29.24 kg/m.    Physical Exam Vitals reviewed.  Constitutional:      Appearance: He is well-developed.  HENT:     Head: Normocephalic and atraumatic.     Right Ear: Tympanic membrane, ear canal and external ear normal.     Left Ear: Tympanic membrane, ear canal and external ear normal.     Nose: No nasal deformity, septal deviation, mucosal edema or rhinorrhea.     Right  Turbinates: Not enlarged or swollen.     Left Turbinates: Not enlarged or swollen.     Right Sinus: No maxillary sinus tenderness or frontal sinus tenderness.     Left Sinus: No maxillary sinus tenderness or frontal sinus tenderness.     Comments: Polyps bilaterally are both much smaller than previous evaluations.       Mouth/Throat:     Mouth: Mucous membranes are not pale and not dry.     Pharynx: Uvula midline.  Eyes:     General: Lids are normal. No allergic shiner.       Right eye: No discharge.        Left eye: No discharge.     Conjunctiva/sclera: Conjunctivae normal.     Right eye: Right conjunctiva is not injected. No chemosis.    Left eye: Left conjunctiva is not injected. No chemosis.    Pupils: Pupils are equal, round, and reactive to light.  Cardiovascular:     Rate and Rhythm: Normal rate and regular rhythm.     Heart sounds: Normal heart sounds.  Pulmonary:     Effort: Pulmonary effort is normal. No tachypnea, accessory muscle usage or respiratory distress.     Breath sounds: Normal breath sounds. No wheezing, rhonchi or rales.  Chest:     Chest wall: No tenderness.  Lymphadenopathy:     Cervical: No cervical adenopathy.  Skin:    Coloration: Skin is not pale.     Findings: No abrasion, erythema, petechiae or rash. Rash is not papular, urticarial or vesicular.  Neurological:     Mental Status: He is alert.  Psychiatric:        Behavior: Behavior is cooperative.      Diagnostic studies: none       Drexel Gentles, MD  [facility] 

## 2024-01-09 NOTE — Patient Instructions (Addendum)
 1. Nasal Polyps  - Continue Dupixent  every 2 weeks.  - Continue with Xhance  1-2 sprays per nostril up to twice daily.    2. Seasonal and perennial allergic rhinitis - Previous testing showed: grasses, weeds, trees, indoor molds, and outdoor molds - Continue taking: Zyrtec  (cetirizine ) 10mg  tablet once daily  - You can use an extra dose of the antihistamine, if needed, for breakthrough symptoms.  - Consider nasal saline rinses 1-2 times daily to remove allergens from the nasal cavities as well as help with mucous clearance (this is especially helpful to do before the nasal sprays are given).   3. Return in about 1 year (around 01/08/2025). You can have the follow up appointment with Dr. Idolina Maker or a Nurse Practicioner (our Nurse Practitioners are excellent and always have Physician oversight!).    Please inform us  of any Emergency Department visits, hospitalizations, or changes in symptoms. Call us  before going to the ED for breathing or allergy  symptoms since we might be able to fit you in for a sick visit. Feel free to contact us  anytime with any questions, problems, or concerns.  It was a pleasure to see you again today!  Websites that have reliable patient information: 1. American Academy of Asthma, Allergy , and Immunology: www.aaaai.org 2. Food Allergy  Research and Education (FARE): foodallergy.org 3. Mothers of Asthmatics: http://www.asthmacommunitynetwork.org 4. [employer] of Allergy , Asthma, and Immunology: www.acaai.org      "Like" us  on Facebook and Instagram for our latest updates!      A healthy democracy works best when [employer] participate! Make sure you are registered to vote! If you have moved or changed any of your contact information, you will need to get this updated before voting! Scan the QR codes below to learn more!

## 2024-01-28 ENCOUNTER — Ambulatory Visit

## 2024-01-28 DIAGNOSIS — J339 Nasal polyp, unspecified: Secondary | ICD-10-CM

## 2024-02-07 ENCOUNTER — Telehealth: Payer: Self-pay | Admitting: Allergy & Immunology

## 2024-02-07 NOTE — Telephone Encounter (Signed)
 [facility] left a voicemail that this patient's Dupixent  is about to expire and they would like a reauthorization.

## 2024-02-11 ENCOUNTER — Ambulatory Visit (INDEPENDENT_AMBULATORY_CARE_PROVIDER_SITE_OTHER)

## 2024-02-11 DIAGNOSIS — J339 Nasal polyp, unspecified: Secondary | ICD-10-CM | POA: Diagnosis not present

## 2024-02-13 ENCOUNTER — Telehealth: Payer: Self-pay | Admitting: Allergy & Immunology

## 2024-02-13 NOTE — Telephone Encounter (Signed)
 Pharmacy request a pa for dupixent 

## 2024-02-14 NOTE — Telephone Encounter (Signed)
 PA exp 02/27/24

## 2024-02-25 ENCOUNTER — Ambulatory Visit (INDEPENDENT_AMBULATORY_CARE_PROVIDER_SITE_OTHER)

## 2024-02-25 DIAGNOSIS — J339 Nasal polyp, unspecified: Secondary | ICD-10-CM

## 2024-03-10 ENCOUNTER — Ambulatory Visit

## 2024-03-10 DIAGNOSIS — J339 Nasal polyp, unspecified: Secondary | ICD-10-CM | POA: Diagnosis not present

## 2024-03-24 ENCOUNTER — Ambulatory Visit (INDEPENDENT_AMBULATORY_CARE_PROVIDER_SITE_OTHER)

## 2024-03-24 DIAGNOSIS — J339 Nasal polyp, unspecified: Secondary | ICD-10-CM

## 2024-04-07 ENCOUNTER — Ambulatory Visit

## 2024-04-07 DIAGNOSIS — J339 Nasal polyp, unspecified: Secondary | ICD-10-CM

## 2024-04-21 ENCOUNTER — Ambulatory Visit (INDEPENDENT_AMBULATORY_CARE_PROVIDER_SITE_OTHER)

## 2024-04-21 DIAGNOSIS — J339 Nasal polyp, unspecified: Secondary | ICD-10-CM

## 2024-05-05 ENCOUNTER — Ambulatory Visit (INDEPENDENT_AMBULATORY_CARE_PROVIDER_SITE_OTHER)

## 2024-05-05 DIAGNOSIS — J339 Nasal polyp, unspecified: Secondary | ICD-10-CM | POA: Diagnosis not present

## 2024-05-07 ENCOUNTER — Other Ambulatory Visit: Payer: Self-pay | Admitting: Family Medicine

## 2024-05-19 ENCOUNTER — Ambulatory Visit (INDEPENDENT_AMBULATORY_CARE_PROVIDER_SITE_OTHER)

## 2024-05-19 DIAGNOSIS — J339 Nasal polyp, unspecified: Secondary | ICD-10-CM

## 2024-06-02 ENCOUNTER — Ambulatory Visit (INDEPENDENT_AMBULATORY_CARE_PROVIDER_SITE_OTHER)

## 2024-06-02 DIAGNOSIS — J339 Nasal polyp, unspecified: Secondary | ICD-10-CM

## 2024-06-02 NOTE — Progress Notes (Signed)
 Immunotherapy   Patient Details  Name: Cristian Brown MRN: [id number] Date of Birth: 1971-05-21  06/02/2024  Cristian Brown started injections for  Dupixent  self administration.  Following schedule: Every fourteen days. Frequency:Every two weeks.  Epi-Pen:Not needed. Consent signed previously and patient instructions given on how to self administer Dupixant into the thigh and abdomen successfully. After being taught patient successfully injected himself with Dupixent  in the thigh without an issue.    Cristian Brown 06/02/2024, 11:34 AM

## 2024-06-06 ENCOUNTER — Telehealth: Payer: Self-pay

## 2024-06-06 NOTE — Telephone Encounter (Signed)
 Received Dupixent  shipment at the [facility] office. I called the patient and informed to stop by and pick up as he will now be administering at home.

## 2024-06-16 ENCOUNTER — Ambulatory Visit

## 2024-07-29 ENCOUNTER — Encounter (INDEPENDENT_AMBULATORY_CARE_PROVIDER_SITE_OTHER): Payer: Self-pay | Admitting: *Deleted

## 2025-01-09 ENCOUNTER — Ambulatory Visit: Admitting: Allergy & Immunology
